# Patient Record
Sex: Male | Born: 1955
Health system: Southern US, Community
[De-identification: ages and names within clinical notes are randomized; demographics above are authoritative.]

## PROBLEM LIST (undated history)

## (undated) DIAGNOSIS — E119 Type 2 diabetes mellitus without complications: Secondary | ICD-10-CM

## (undated) DIAGNOSIS — J439 Emphysema, unspecified: Secondary | ICD-10-CM

## (undated) DIAGNOSIS — E785 Hyperlipidemia, unspecified: Secondary | ICD-10-CM

## (undated) DIAGNOSIS — C4491 Basal cell carcinoma of skin, unspecified: Secondary | ICD-10-CM

## (undated) HISTORY — DX: Hyperlipidemia, unspecified: E78.5

## (undated) HISTORY — DX: Emphysema, unspecified: J43.9

## (undated) HISTORY — DX: Basal cell carcinoma of skin, unspecified: C44.91

## (undated) HISTORY — PX: COLONOSCOPY: SHX174

## (undated) HISTORY — PX: UPPER GASTROINTESTINAL ENDOSCOPY: SHX188

---

## 1999-03-16 ENCOUNTER — Emergency Department (HOSPITAL_COMMUNITY): Admission: EM | Admit: 1999-03-16 | Discharge: 1999-03-16 | Payer: Self-pay | Admitting: Emergency Medicine

## 1999-03-16 ENCOUNTER — Encounter: Payer: Self-pay | Admitting: Emergency Medicine

## 1999-03-29 ENCOUNTER — Encounter: Admission: RE | Admit: 1999-03-29 | Discharge: 1999-06-27 | Payer: Self-pay | Admitting: *Deleted

## 1999-03-30 ENCOUNTER — Encounter: Payer: Self-pay | Admitting: *Deleted

## 1999-03-30 ENCOUNTER — Ambulatory Visit: Admission: RE | Admit: 1999-03-30 | Discharge: 1999-03-30 | Payer: Self-pay | Admitting: *Deleted

## 1999-04-20 ENCOUNTER — Encounter: Payer: Self-pay | Admitting: Occupational Medicine

## 1999-04-20 ENCOUNTER — Ambulatory Visit (HOSPITAL_COMMUNITY): Admission: RE | Admit: 1999-04-20 | Discharge: 1999-04-20 | Payer: Self-pay | Admitting: Occupational Medicine

## 2007-04-17 ENCOUNTER — Ambulatory Visit (HOSPITAL_BASED_OUTPATIENT_CLINIC_OR_DEPARTMENT_OTHER): Admission: RE | Admit: 2007-04-17 | Discharge: 2007-04-17 | Payer: Self-pay | Admitting: Orthopedic Surgery

## 2007-09-05 ENCOUNTER — Ambulatory Visit (HOSPITAL_COMMUNITY): Admission: RE | Admit: 2007-09-05 | Discharge: 2007-09-05 | Payer: Self-pay | Admitting: Orthopedic Surgery

## 2011-05-05 NOTE — Op Note (Signed)
NAME:  Jesse Mckee, Jesse Mckee NO.:  [id number]   MEDICAL RECORD NO.:  [id number]          PATIENT TYPE:  AMB   LOCATION:  DSC                          FACILITY:  [facility]   PHYSICIAN:  Deidre Ala, M.D.    DATE OF BIRTH:  Jan 06, 1956   DATE OF PROCEDURE:  04/17/2007  DATE OF DISCHARGE:                               OPERATIVE REPORT   PREOPERATIVE DIAGNOSIS:  1. Impingement left shoulder with type 3 acromion.  2. MRI positive for retracted rotator cuff tear.  3. AC joint arthritis.   POSTOPERATIVE DIAGNOSIS:  1. Impingement syndrome left shoulder with type 3 acromion.  2. Nonrepairable retracted rotator cuff tear.  3. AC joint arthritis, severe.  4. Glenoid labral fraying with intact slap and biceps anchor with      intact long head biceps.   PROCEDURE:  1. Left shoulder operative arthroscopy with subacromial arch      decompression acromioplasty.  2. Extensive debridement of rotator cuff tear and glenoid labral tear      and tuberosity plasty.  3. Arthroscopic distal clavicle resection.   SURGEON:  1. Charlesetta Shanks, M.D.   ASSISTANT:  Phineas Semen, P.A.   ANESTHESIA:  General with endotracheal after scalene block.   CULTURES:  None.   DRAINS:  None.   BLOOD LOSS:  Minimal.   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  He came to the office.  I suspected the  rotator cuff tear and got an MRI scan which showed a complex signal  indicating probably tendinosus superimposed upon retracted rotator cuff  tear that may still be repairable.  He was taken to the operating room  where exam revealed fraying of the superior labrum with an intact long  head biceps with no evidence of biceps tendinopathy when pulled from the  intertubercular area into the joint.  The glenohumeral joint had minor  arthritic changes.  I debrided the rotator, the  labrum, etc.  In the  subacromial space in a sharp craggy anterior acromion which was resected  back to the Caspari margins, obvious arthritic distal clavicle.  The  rotator cuff had marked degeneration within a rim of the avulsed area.  When I debrided this poor tissue and he is a smoker, it was not able to  pull back the rotator cuff to the footprint without the arm abducted  almost 70 degrees.  I did not feel as a smoker this would heal and  therefore debrided the rotator cuff as per [facility]'s  recommendations.  I did a tuberosity plasty to prevent further  impingement.   PROCEDURE:  With anesthesia obtained using a scalene block technique and  then endotracheal tube technique, 1 gram Ancef given IV prophylaxis. The  patient is placed in the supine beach chair position.  The left shoulder  was prepped and draped in standard fashion.  Skin markings were made for  anatomic positioning and 20 mL 0.5% Marcaine with epinephrine was  injected into the subacromial space to open it up.  I then entered the  shoulder through posterior portal, anterolateral portal was established  just lateral to the coracoid.  I then probed and debrided the labrum,  checked the biceps and used the ablator on 1 to smooth.  Portals  reversed and similar shavings carried out.  I then entered the  subacromial space from the posterior portal, anterolateral portal was  established.  I then shaved the anterior undersurface of the acromion  and used the ablator on 1 to smooth.  I then did an acromioplasty with a  6.0 bur to the roof of the subacromial space in the manner of Vincent Gros  thus also releasing the CA ligament.  The scope was then turned medially  sideways where through an anterior portal I did an arthroscopic distal  clavicle resection two shaver-breadths in.  I then entered the shoulder  through the anterolateral portal and viewed the rotator cuff avulsion.  I debrided the edges used the ablator to smooth  and then made another  portal just posterior to try to pull the rotator cuff back to its  footprint and it was not possible to pull it without great amount of  tension.  Given his smoking status and the configuration of the tear I  therefore felt it was not repairable.  I debrided the rotator cuff, used  the ablator to smooth, the biceps was intact.  Then brought in the 6.0  bur and completed tuberosity plasty and ablator was also used to smooth.  The shoulder was then irrigated through the scope, 0.5% Marcaine was not  used for the portals.  The portals left open.  A bulky sterile  compressive dressing was applied with sling.  The patient having  tolerated procedure well was awakened, taken to recovery room in  satisfactory condition to be discharged per outpatient routine, given  Percocet for pain, and told to call the office for appointment for  recheck tomorrow.           ______________________________  V. Charlesetta Shanks, M.D.     VEP/MEDQ  D:  04/17/2007  T:  04/17/2007  Job:  [id number]

## 2015-03-03 ENCOUNTER — Ambulatory Visit (INDEPENDENT_AMBULATORY_CARE_PROVIDER_SITE_OTHER): Payer: 59

## 2015-03-03 ENCOUNTER — Ambulatory Visit (INDEPENDENT_AMBULATORY_CARE_PROVIDER_SITE_OTHER): Payer: 59 | Admitting: Family Medicine

## 2015-03-03 VITALS — BP 130/80 | HR 84 | Temp 97.9°F | Resp 16 | Ht 71.0 in | Wt 228.0 lb

## 2015-03-03 DIAGNOSIS — J3489 Other specified disorders of nose and nasal sinuses: Secondary | ICD-10-CM | POA: Diagnosis not present

## 2015-03-03 DIAGNOSIS — J329 Chronic sinusitis, unspecified: Secondary | ICD-10-CM

## 2015-03-03 DIAGNOSIS — F17200 Nicotine dependence, unspecified, uncomplicated: Secondary | ICD-10-CM

## 2015-03-03 DIAGNOSIS — R059 Cough, unspecified: Secondary | ICD-10-CM

## 2015-03-03 DIAGNOSIS — Z720 Tobacco use: Secondary | ICD-10-CM | POA: Diagnosis not present

## 2015-03-03 DIAGNOSIS — R05 Cough: Secondary | ICD-10-CM | POA: Diagnosis not present

## 2015-03-03 LAB — POCT CBC
Granulocyte percent: 74.1 %G (ref 37–80)
HCT, POC: 52.6 % (ref 43.5–53.7)
Hemoglobin: 17.4 g/dL (ref 14.1–18.1)
Lymph, poc: 3.7 — AB (ref 0.6–3.4)
MCH, POC: 29.4 pg (ref 27–31.2)
MCHC: 33 g/dL (ref 31.8–35.4)
MCV: 89 fL (ref 80–97)
MID (cbc): 0.6 (ref 0–0.9)
MPV: 7.4 fL (ref 0–99.8)
POC Granulocyte: 12.2 — AB (ref 2–6.9)
POC LYMPH PERCENT: 22.3 %L (ref 10–50)
POC MID %: 3.6 %M (ref 0–12)
Platelet Count, POC: 224 10*3/uL (ref 142–424)
RBC: 5.91 M/uL (ref 4.69–6.13)
RDW, POC: 13.9 %
WBC: 16.4 10*3/uL — AB (ref 4.6–10.2)

## 2015-03-03 MED ORDER — AMOXICILLIN 875 MG PO TABS: 875.0000 mg | ORAL_TABLET | Freq: Two times a day (BID) | ORAL | Status: DC

## 2015-03-03 NOTE — Progress Notes (Signed)
    MRN: [id number] DOB: 1956/03/23  Subjective:   Jesse Mckee is a 59 y.o. male presenting for chief complaint of Headache and Sinusitis  Reports 4 day history of sinus pain, non-productive dry hacking cough, ear fullness and pain bilaterally. Has tried otc medications including alka-seltzer, antihistamines, Afrin with minimal relief. Denies fevers, sinus congestion, rhinorrhea, ear drainage, sore throat, chest pain, chest tightness, wheezing, shob, n/v, abdominal pain. Wife has been sick with cold symptoms. Admits history of seasonal allergies, controlled with PO antihistamine, denies history of asthma. Smokes 1ppd, has 40+ pack year history, no alcohol. Patient is not interested in quitting smoking. Denies any other aggravating or relieving factors, no other questions or concerns.  Jesse Mckee currently has no medications in their medication list. He has no allergies on file.  Jesse Mckee  has no past medical history on file. Also  has no past surgical history on file.  ROS As in subjective.  Objective:   Vitals: BP 130/80 mmHg  Pulse 84  Temp(Src) 97.9 F (36.6 C) (Oral)  Resp 16  Ht 5\' 11"  (1.803 m)  Wt 228 lb (103.42 kg)  BMI 31.81 kg/m2  SpO2 97%  Physical Exam  Constitutional: He is oriented to person, place, and time and well-developed, well-nourished, and in no distress.  HENT:  TMs injected bilaterally but intact, no effusions. Nasal turbinates with slight erythema and edema bilaterally. Bilateral maxillary sinus tenderness. Oropharynx erythematous with postnasal drainage but no tonsillar abscesses. Mucous membranes moist.  Eyes: Conjunctivae are normal. Right eye exhibits no discharge. Left eye exhibits no discharge. No scleral icterus.  Cardiovascular: Normal rate.  Exam reveals no gallop and no friction rub.   No murmur heard. Pulmonary/Chest: No stridor. No respiratory distress. He has no wheezes. He has no rales. He exhibits no tenderness.  Lymphadenopathy:    He has no  cervical adenopathy.  Neurological: He is alert and oriented to person, place, and time.  Skin: Skin is warm and dry.   Results for orders placed or performed in visit on 03/03/15 (from the past 24 hour(s))  POCT CBC     Status: Abnormal   Collection Time: 03/03/15 12:36 PM  Result Value Ref Range   WBC 16.4 (A) 4.6 - 10.2 K/uL   Lymph, poc 3.7 (A) 0.6 - 3.4   POC LYMPH PERCENT 22.3 10 - 50 %L   MID (cbc) 0.6 0 - 0.9   POC MID % 3.6 0 - 12 %M   POC Granulocyte 12.2 (A) 2 - 6.9   Granulocyte percent 74.1 37 - 80 %G   RBC 5.91 4.69 - 6.13 M/uL   Hemoglobin 17.4 14.1 - 18.1 g/dL   HCT, POC 52.6 43.5 - 53.7 %   MCV 89.0 80 - 97 fL   MCH, POC 29.4 27 - 31.2 pg   MCHC 33.0 31.8 - 35.4 g/dL   RDW, POC 13.9 %   Platelet Count, POC 224 142 - 424 K/uL   MPV 7.4 0 - 99.8 fL   [facility] reading (PRIMARY) by  Dr. Linna Darner and PA-Rosslyn Pasion. Chest: No acute process, normal chest x-ray.  Assessment and Plan :   1. Cough 2. Tobacco use disorder 3. Sinusitis, unspecified chronicity, unspecified location - Start Amoxicillin x10 days, advised rest and hydration. Recommended patient quit smoking, he is not interested right now. - Return if no improvement or worsening symptoms despite antibiotic course.  Jaynee Eagles, PA-C [facility] [phone] 03/03/2015 12:05 PM

## 2015-03-03 NOTE — Progress Notes (Signed)
Patient discussed with [facility]. X-rays were examined and lungs appear clear. With the elevated white blood count agree with need for antibiotic treatment for the sinuses. I did not examine the patient myself, but discussed and agreed with treatment plan.

## 2015-03-03 NOTE — Patient Instructions (Addendum)
- You can use normal saline nasal spray over the counter. - You may take 400mg  of ibuprofen over the counter with food.  Sinusitis Sinusitis is redness, soreness, and inflammation of the paranasal sinuses. Paranasal sinuses are air pockets within the bones of your face (beneath the eyes, the middle of the forehead, or above the eyes). In healthy paranasal sinuses, mucus is able to drain out, and air is able to circulate through them by way of your nose. However, when your paranasal sinuses are inflamed, mucus and air can become trapped. This can allow bacteria and other germs to grow and cause infection. Sinusitis can develop quickly and last only a short time (acute) or continue over a long period (chronic). Sinusitis that lasts for more than 12 weeks is considered chronic.  CAUSES  Causes of sinusitis include:  Allergies.  Structural abnormalities, such as displacement of the cartilage that separates your nostrils (deviated septum), which can decrease the air flow through your nose and sinuses and affect sinus drainage.  Functional abnormalities, such as when the small hairs (cilia) that line your sinuses and help remove mucus do not work properly or are not present. SIGNS AND SYMPTOMS  Symptoms of acute and chronic sinusitis are the same. The primary symptoms are pain and pressure around the affected sinuses. Other symptoms include:  Upper toothache.  Earache.  Headache.  Bad breath.  Decreased sense of smell and taste.  A cough, which worsens when you are lying flat.  Fatigue.  Fever.  Thick drainage from your nose, which often is green and may contain pus (purulent).  Swelling and warmth over the affected sinuses. DIAGNOSIS  Your health care provider will perform a physical exam. During the exam, your health care provider may:  Look in your nose for signs of abnormal growths in your nostrils (nasal polyps).  Tap over the affected sinus to check for signs of  infection.  View the inside of your sinuses (endoscopy) using an imaging device that has a light attached (endoscope). If your health care provider suspects that you have chronic sinusitis, one or more of the following tests may be recommended:  Allergy tests.  Nasal culture. A sample of mucus is taken from your nose, sent to a lab, and screened for bacteria.  Nasal cytology. A sample of mucus is taken from your nose and examined by your health care provider to determine if your sinusitis is related to an allergy. TREATMENT  Most cases of acute sinusitis are related to a viral infection and will resolve on their own within 10 days. Sometimes medicines are prescribed to help relieve symptoms (pain medicine, decongestants, nasal steroid sprays, or saline sprays).  However, for sinusitis related to a bacterial infection, your health care provider will prescribe antibiotic medicines. These are medicines that will help kill the bacteria causing the infection.  Rarely, sinusitis is caused by a fungal infection. In theses cases, your health care provider will prescribe antifungal medicine. For some cases of chronic sinusitis, surgery is needed. Generally, these are cases in which sinusitis recurs more than 3 times per year, despite other treatments. HOME CARE INSTRUCTIONS   Drink plenty of water. Water helps thin the mucus so your sinuses can drain more easily.  Use a humidifier.  Inhale steam 3 to 4 times a day (for example, sit in the bathroom with the shower running).  Apply a warm, moist washcloth to your face 3 to 4 times a day, or as directed by your health care provider.  Use saline nasal sprays to help moisten and clean your sinuses.  Take medicines only as directed by your health care provider.  If you were prescribed either an antibiotic or antifungal medicine, finish it all even if you start to feel better. SEEK IMMEDIATE MEDICAL CARE IF:  You have increasing pain or severe  headaches.  You have nausea, vomiting, or drowsiness.  You have swelling around your face.  You have vision problems.  You have a stiff neck.  You have difficulty breathing. MAKE SURE YOU:   Understand these instructions.  Will watch your condition.  Will get help right away if you are not doing well or get worse. Document Released: 12/04/2005 Document Revised: 04/20/2014 Document Reviewed: 12/19/2011 [facility] Patient Information 2015 [place], [place]. This information is not intended to replace advice given to you by your health care provider. Make sure you discuss any questions you have with your health care provider.

## 2015-05-28 ENCOUNTER — Ambulatory Visit (INDEPENDENT_AMBULATORY_CARE_PROVIDER_SITE_OTHER): Payer: 59

## 2015-05-28 ENCOUNTER — Other Ambulatory Visit: Payer: Self-pay | Admitting: Family Medicine

## 2015-05-28 ENCOUNTER — Ambulatory Visit (INDEPENDENT_AMBULATORY_CARE_PROVIDER_SITE_OTHER): Payer: 59 | Admitting: Family Medicine

## 2015-05-28 VITALS — BP 138/82 | HR 84 | Temp 97.8°F | Resp 17 | Ht 71.5 in | Wt 226.2 lb

## 2015-05-28 DIAGNOSIS — R140 Abdominal distension (gaseous): Secondary | ICD-10-CM

## 2015-05-28 DIAGNOSIS — R1010 Upper abdominal pain, unspecified: Secondary | ICD-10-CM

## 2015-05-28 DIAGNOSIS — R1012 Left upper quadrant pain: Principal | ICD-10-CM

## 2015-05-28 DIAGNOSIS — R1011 Right upper quadrant pain: Secondary | ICD-10-CM

## 2015-05-28 DIAGNOSIS — R112 Nausea with vomiting, unspecified: Secondary | ICD-10-CM

## 2015-05-28 DIAGNOSIS — F1021 Alcohol dependence, in remission: Secondary | ICD-10-CM | POA: Diagnosis not present

## 2015-05-28 DIAGNOSIS — R739 Hyperglycemia, unspecified: Secondary | ICD-10-CM

## 2015-05-28 LAB — COMPREHENSIVE METABOLIC PANEL
ALBUMIN: 4.4 g/dL (ref 3.5–5.2)
ALT: 20 U/L (ref 0–53)
AST: 14 U/L (ref 0–37)
Alkaline Phosphatase: 111 U/L (ref 39–117)
BUN: 9 mg/dL (ref 6–23)
CALCIUM: 9.8 mg/dL (ref 8.4–10.5)
CO2: 26 meq/L (ref 19–32)
Chloride: 99 mEq/L (ref 96–112)
Creat: 0.74 mg/dL (ref 0.50–1.35)
Glucose, Bld: 205 mg/dL — ABNORMAL HIGH (ref 70–99)
Potassium: 4.4 mEq/L (ref 3.5–5.3)
SODIUM: 136 meq/L (ref 135–145)
Total Bilirubin: 0.8 mg/dL (ref 0.2–1.2)
Total Protein: 7.1 g/dL (ref 6.0–8.3)

## 2015-05-28 LAB — POCT URINALYSIS DIPSTICK
Bilirubin, UA: NEGATIVE
Blood, UA: NEGATIVE
Glucose, UA: NEGATIVE
Ketones, UA: NEGATIVE
Leukocytes, UA: NEGATIVE
Nitrite, UA: NEGATIVE
Protein, UA: NEGATIVE
Spec Grav, UA: 1.01
Urobilinogen, UA: 0.2
pH, UA: 6

## 2015-05-28 LAB — POCT CBC
Granulocyte percent: 70.6 %G (ref 37–80)
HCT, POC: 51.7 % (ref 43.5–53.7)
Hemoglobin: 17.4 g/dL (ref 14.1–18.1)
Lymph, poc: 3.2 (ref 0.6–3.4)
MCH, POC: 29.4 pg (ref 27–31.2)
MCHC: 33.6 g/dL (ref 31.8–35.4)
MCV: 87.6 fL (ref 80–97)
MID (cbc): 0.8 (ref 0–0.9)
MPV: 7.2 fL (ref 0–99.8)
POC GRANULOCYTE: 9.7 — AB (ref 2–6.9)
POC LYMPH PERCENT: 23.3 %L (ref 10–50)
POC MID %: 6.1 % (ref 0–12)
Platelet Count, POC: 253 10*3/uL (ref 142–424)
RBC: 5.9 M/uL (ref 4.69–6.13)
RDW, POC: 13.7 %
WBC: 13.8 10*3/uL — AB (ref 4.6–10.2)

## 2015-05-28 LAB — POCT UA - MICROSCOPIC ONLY
CASTS, UR, LPF, POC: NEGATIVE
CRYSTALS, UR, HPF, POC: NEGATIVE
Mucus, UA: NEGATIVE
RBC, URINE, MICROSCOPIC: NEGATIVE
YEAST UA: NEGATIVE

## 2015-05-28 NOTE — Patient Instructions (Signed)
Drink plenty of liquids. Only eat light meals today.  Take MiraLAX one dose now and another dose tonight at bedtime if needed. If necessary can also take some milk of magnesia.  Try and walk around a little bit which sometimes helps get the bowels moving better.  If you develop significant fevers, increased pain, passing any blood, or recurrent vomiting, or any other concerning symptoms go to the emergency room.  Plan to come back tomorrow morning to let me recheck you and we will check a blood count when you come in.

## 2015-05-28 NOTE — Progress Notes (Signed)
Subjective:  Patient ID: Jesse Mckee, male    DOB: 11-29-1956  Age: 60 y.o. 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 He is generally quite healthy. He brought his wife in early in the week. After going home that night he developed epigastric pain, nausea and bloating and some vomiting a believe. He did not had diarrhea. His bowels moved after he took some milk of magnesia. He has persisted with a lot of bloating and rumbling in discomfort. He feels like he has hot flashes, like his fever may be going up and down He does not use drugs. He does have a history of alcoholism, not not a drinker for the last 6 years. He has not had any surgery on his abdomen. He does continue to smoke about a pack a day, no quit plan in place.   Objective:   Healthy-appearing man in no major distress. TMs normal. Throat clear. Neck supple without significant nodes. Chest clear to auscultation. Heart regular without murmurs. Abdomen has fairly active bowel sounds, no tinkling or rushes. He is a little tender across the epigastrium on percussion. His abdomen is soft without masses he is tender in both upper quadrants of his abdomen, maybe a little more on the right than the left. A little bit of rebounding on the right. Extremities normal. Skin normal.  Will plan to check blood chemistries, CBC, urinalysis, and abdominal x-rays  [facility] reading (PRIMARY) by  Dr. Linna Darner Possible COPD on chest. Abdominal films do not show any free air. Extensive stool burden. No other gross abnormalities noted. No air-fluid levels. No loops of bowel.  Results for orders placed or performed in visit on 05/28/15  POCT urinalysis dipstick  Result Value Ref Range   Color, UA yellow    Clarity, UA clear    Glucose, UA neg    Bilirubin, UA neg    Ketones, UA neg    Spec Grav, UA 1.010    Blood, UA neg    pH, UA 6.0    Protein, UA neg    Urobilinogen, UA 0.2    Nitrite, UA neg    Leukocytes, UA Negative   POCT UA  - Microscopic Only  Result Value Ref Range   WBC, Ur, HPF, POC 0-2    RBC, urine, microscopic neg    Bacteria, U Microscopic naeg    Mucus, UA neg    Epithelial cells, urine per micros 0-1    Crystals, Ur, HPF, POC neg    Casts, Ur, LPF, POC neg    Yeast, UA neg   POCT CBC  Result Value Ref Range   WBC 13.8 (A) 4.6 - 10.2 K/uL   Lymph, poc 3.2 0.6 - 3.4   POC LYMPH PERCENT 23.3 10 - 50 %L   MID (cbc) 0.8 0 - 0.9   POC MID % 6.1 0 - 12 %M   POC Granulocyte 9.7 (A) 2 - 6.9   Granulocyte percent 70.6 37 - 80 %G   RBC 5.90 4.69 - 6.13 M/uL   Hemoglobin 17.4 14.1 - 18.1 g/dL   HCT, POC 51.7 43.5 - 53.7 %   MCV 87.6 80 - 97 fL   MCH, POC 29.4 27 - 31.2 pg   MCHC 33.6 31.8 - 35.4 g/dL   RDW, POC 13.7 %   Platelet Count, POC 253 142 - 424 K/uL   MPV 7.2 0 - 99.8 fL   .    Assessment & Plan:   Assessment:  Abdominal pain, primarily upper abdomen, more right upper than left upper Constipation  Plan: When he comes back to see me tomorrow please do a CBC on arrival Patient Instructions  Drink plenty of liquids. Only eat light meals today.  Take MiraLAX one dose now and another dose tonight at bedtime if needed. If necessary can also take some milk of magnesia.  Try and walk around a little bit which sometimes helps get the bowels moving better.  If you develop significant fevers, increased pain, passing any blood, or recurrent vomiting, or any other concerning symptoms go to the emergency room.  Plan to come back tomorrow morning to let me recheck you and we will check a blood count when you come in.     HOPPER,DAVID, MD 05/28/2015

## 2015-05-29 ENCOUNTER — Ambulatory Visit (HOSPITAL_BASED_OUTPATIENT_CLINIC_OR_DEPARTMENT_OTHER)
Admission: RE | Admit: 2015-05-29 | Discharge: 2015-05-29 | Disposition: A | Discharge status: Home / Self Care | Payer: 59 | Source: Ambulatory Visit | Attending: Physician Assistant | Admitting: Physician Assistant

## 2015-05-29 ENCOUNTER — Ambulatory Visit (INDEPENDENT_AMBULATORY_CARE_PROVIDER_SITE_OTHER): Payer: 59 | Admitting: Family Medicine

## 2015-05-29 ENCOUNTER — Encounter (HOSPITAL_BASED_OUTPATIENT_CLINIC_OR_DEPARTMENT_OTHER): Payer: Self-pay

## 2015-05-29 VITALS — BP 128/76 | HR 95 | Temp 98.4°F | Resp 18 | Ht 71.5 in | Wt 224.8 lb

## 2015-05-29 DIAGNOSIS — R1011 Right upper quadrant pain: Secondary | ICD-10-CM | POA: Diagnosis not present

## 2015-05-29 DIAGNOSIS — R1010 Upper abdominal pain, unspecified: Secondary | ICD-10-CM

## 2015-05-29 DIAGNOSIS — K5730 Diverticulosis of large intestine without perforation or abscess without bleeding: Secondary | ICD-10-CM | POA: Diagnosis not present

## 2015-05-29 DIAGNOSIS — R1012 Left upper quadrant pain: Secondary | ICD-10-CM

## 2015-05-29 LAB — POCT CBC
Granulocyte percent: 71.4 %G (ref 37–80)
HCT, POC: 49.8 % (ref 43.5–53.7)
Hemoglobin: 17.2 g/dL (ref 14.1–18.1)
LYMPH, POC: 3.1 (ref 0.6–3.4)
MCH, POC: 29.9 pg (ref 27–31.2)
MCHC: 34.6 g/dL (ref 31.8–35.4)
MCV: 86.5 fL (ref 80–97)
MID (CBC): 0.6 (ref 0–0.9)
MPV: 7.2 fL (ref 0–99.8)
POC Granulocyte: 9.3 — AB (ref 2–6.9)
POC LYMPH PERCENT: 24 %L (ref 10–50)
POC MID %: 4.6 %M (ref 0–12)
Platelet Count, POC: 248 10*3/uL (ref 142–424)
RBC: 5.76 M/uL (ref 4.69–6.13)
RDW, POC: 13.5 %
WBC: 13 10*3/uL — AB (ref 4.6–10.2)

## 2015-05-29 MED ORDER — METRONIDAZOLE 500 MG PO TABS: 500.0000 mg | ORAL_TABLET | Freq: Two times a day (BID) | ORAL | Status: DC

## 2015-05-29 MED ORDER — IOHEXOL 300 MG/ML  SOLN
100.0000 mL | Freq: Once | INTRAMUSCULAR | Status: AC | PRN
  Administered 2015-05-29: 100 mL via INTRAVENOUS

## 2015-05-29 MED ORDER — CIPROFLOXACIN HCL 500 MG PO TABS: 500.0000 mg | ORAL_TABLET | Freq: Two times a day (BID) | ORAL | Status: DC

## 2015-05-29 NOTE — Progress Notes (Signed)
  Subjective:  Patient ID: Jesse Mckee, male    DOB: 03-Jan-1956  Age: 59 y.o. MRN: [id number]  Patient is here for recheck with regard to his right upper quadrant pain. Actually is not hurting as much across the whole upper abdomen today as it was yesterday, and it is hurting more than the right upper quadrant still. He has not had nausea or vomiting. He does have some pain back into his spine but he thinks that's from activity. He has had normal bowel movement. No fever.   Objective:   Chest clear. Heart regular. Abdomen has normal bowel sounds. Soft but tender in the right upper quadrant  Assessment & Plan:    Assessment: Right upper quadrant abdominal pain. Rule out acute abdomen such as gallbladder, diverticulitis, or even pancreatitis    Plan: Get CT scan today. Patient Instructions  CT scan of abdomen at the [facility] [address] facility. You are to be there by 2:30.  No change in treatment. We will let you know the results of your study.   If you are abruptly worse at anytime return or go to the emergency room.      CT scan showed diverticulosis but no diverticulitis. However given the fact that he has diverticulosis I think I will put him on anabolic in case he has a very low-grade diverticulitis colitis causing the persistent elevated white blood count even though we cannot see it on CT. Will go ahead and treat with Cipro and Flagyl. I calledthe patient and explained things to him and he understands to return if in all worse at anytime. He will still take the MiraLAX as I discussed with him.  HOPPER,DAVID, MD 05/29/2015

## 2015-05-29 NOTE — Patient Instructions (Addendum)
CT scan of abdomen at the [facility] [address] facility. You are to be there by 2:30.  No change in treatment. We will let you know the results of your study.   If you are abruptly worse at anytime return or go to the emergency room.

## 2015-05-31 ENCOUNTER — Telehealth: Payer: Self-pay

## 2015-05-31 NOTE — Telephone Encounter (Signed)
Patient was seen at [facility] on 05/29/15 for a Ct Abdomen  Pelvis With contrast.  I have submitted for prior auth retro for patient with [facility] and it is requiring a Peer to Peer Review. Please contact [place] at [phone] option #3 case #[id number], [facility] id #[id number] group [phone]

## 2015-06-02 ENCOUNTER — Encounter: Payer: Self-pay | Admitting: *Deleted

## 2015-06-02 ENCOUNTER — Other Ambulatory Visit (INDEPENDENT_AMBULATORY_CARE_PROVIDER_SITE_OTHER): Payer: 59

## 2015-06-02 DIAGNOSIS — R739 Hyperglycemia, unspecified: Secondary | ICD-10-CM

## 2015-06-02 LAB — GLUCOSE, POCT (MANUAL RESULT ENTRY): POC Glucose: 225 mg/dl — AB (ref 70–99)

## 2015-06-02 LAB — HEMOGLOBIN A1C
Hgb A1c MFr Bld: 7.7 % — ABNORMAL HIGH (ref <5.7)
MEAN PLASMA GLUCOSE: 174 mg/dL — AB (ref <117)

## 2015-06-16 ENCOUNTER — Encounter: Payer: Self-pay | Admitting: Radiology

## 2016-02-18 ENCOUNTER — Ambulatory Visit (INDEPENDENT_AMBULATORY_CARE_PROVIDER_SITE_OTHER): Payer: 59 | Admitting: Emergency Medicine

## 2016-02-18 ENCOUNTER — Ambulatory Visit (INDEPENDENT_AMBULATORY_CARE_PROVIDER_SITE_OTHER): Payer: 59

## 2016-02-18 VITALS — BP 148/78 | HR 95 | Temp 99.4°F | Resp 16 | Ht 71.0 in | Wt 229.0 lb

## 2016-02-18 DIAGNOSIS — R059 Cough, unspecified: Secondary | ICD-10-CM

## 2016-02-18 DIAGNOSIS — J101 Influenza due to other identified influenza virus with other respiratory manifestations: Secondary | ICD-10-CM

## 2016-02-18 DIAGNOSIS — R05 Cough: Secondary | ICD-10-CM

## 2016-02-18 DIAGNOSIS — R509 Fever, unspecified: Secondary | ICD-10-CM | POA: Diagnosis not present

## 2016-02-18 LAB — POCT CBC
GRANULOCYTE PERCENT: 78.8 % (ref 37–80)
HCT, POC: 47.6 % (ref 43.5–53.7)
Hemoglobin: 17.4 g/dL (ref 14.1–18.1)
Lymph, poc: 1.6 (ref 0.6–3.4)
MCH, POC: 31.4 pg — AB (ref 27–31.2)
MCHC: 36.5 g/dL — AB (ref 31.8–35.4)
MCV: 85.9 fL (ref 80–97)
MID (CBC): 0.5 (ref 0–0.9)
MPV: 6.7 fL (ref 0–99.8)
POC Granulocyte: 8 — AB (ref 2–6.9)
POC LYMPH %: 16.2 % (ref 10–50)
POC MID %: 5 %M (ref 0–12)
Platelet Count, POC: 153 10*3/uL (ref 142–424)
RBC: 5.54 M/uL (ref 4.69–6.13)
RDW, POC: 13.9 %
WBC: 10.1 10*3/uL (ref 4.6–10.2)

## 2016-02-18 LAB — POCT INFLUENZA A/B
INFLUENZA B, POC: POSITIVE — AB
Influenza A, POC: NEGATIVE

## 2016-02-18 MED ORDER — OSELTAMIVIR PHOSPHATE 75 MG PO CAPS: 75.0000 mg | ORAL_CAPSULE | Freq: Two times a day (BID) | ORAL | Status: DC

## 2016-02-18 MED ORDER — BENZONATATE 100 MG PO CAPS: 100.0000 mg | ORAL_CAPSULE | Freq: Three times a day (TID) | ORAL | Status: DC | PRN

## 2016-02-18 NOTE — Progress Notes (Signed)
By signing my name below, I, Raven Small, attest that this documentation has been prepared under the direction and in the presence of Arlyss Queen, MD.  Electronically Signed: Thea Alken, ED Scribe. 02/18/2016. 12:12 PM.   Chief Complaint:  Chief Complaint  Patient presents with  . Cough    Wednesday morning   . Fever  . Diarrhea  . Headache    HPI: Jesse Mckee is a 60 y.o. male who reports to [facility] today complaining of cough. Pt states symptoms started 2 days ago with body aches and nasal congestion. He developed fever and chills yesterday along with dry cough and HA. Pt has been taking OTC mucinex without relief to symptoms. He did not receive flu shot this year. Pt is a smoker.   No past medical history on file. No past surgical history on file. Social History   Social History  . Marital Status: Married    Spouse Name: N/A  . Number of Children: N/A  . Years of Education: N/A   Social History Main Topics  . Smoking status: Current Every Day Smoker -- 1.00 packs/day for 40 years    Types: Cigarettes  . Smokeless tobacco: None  . Alcohol Use: No  . Drug Use: No  . Sexual Activity: Not Asked   Other Topics Concern  . None   Social History Narrative   Family History  Problem Relation Age of Onset  . Cancer Mother   . Cancer Brother    No Known Allergies Prior to Admission medications   Not on File     ROS: The patient denies night sweats, unintentional weight loss, chest pain, palpitations, wheezing, dyspnea on exertion, nausea, vomiting, abdominal pain, dysuria, hematuria, melena, numbness, weakness, or tingling.   All other systems have been reviewed and were otherwise negative with the exception of those mentioned in the HPI and as above.    PHYSICAL EXAM: Filed Vitals:   02/18/16 1118  BP: 148/78  Pulse: 95  Temp: 99.4 F (37.4 C)  Resp: 16   Body mass index is 31.95 kg/(m^2).   General: Alert, no acute distress HEENT:  Normocephalic,  atraumatic, oropharynx patent. Significant nasal congestion.  Eye: [facility] Cardiovascular:  Regular rate and rhythm, no rubs murmurs or gallops.  No Carotid bruits, radial pulse intact. No pedal edema.  Respiratory: Clear to auscultation bilaterally.  No cyanosis, no use of accessory musculature. Rhonchi in both lungs, but no rales. Frequent cough. Good air exchanges.  Abdominal: No organomegaly, abdomen is soft and non-tender, positive bowel sounds.  No masses. Musculoskeletal: Gait intact. No edema, tenderness Skin: No rashes. Neurologic: Facial musculature symmetric. Psychiatric: Patient acts appropriately throughout our interaction. Lymphatic: No cervical or submandibular lymphadenopathy    LABS: Results for orders placed or performed in visit on 02/18/16  POCT Influenza A/B  Result Value Ref Range   Influenza A, POC Negative Negative   Influenza B, POC Positive (A) Negative  POCT CBC  Result Value Ref Range   WBC 10.1 4.6 - 10.2 K/uL   Lymph, poc 1.6 0.6 - 3.4   POC LYMPH PERCENT 16.2 10 - 50 %L   MID (cbc) 0.5 0 - 0.9   POC MID % 5.0 0 - 12 %M   POC Granulocyte 8.0 (A) 2 - 6.9   Granulocyte percent 78.8 37 - 80 %G   RBC 5.54 4.69 - 6.13 M/uL   Hemoglobin 17.4 14.1 - 18.1 g/dL   HCT, POC 47.6 43.5 - 53.7 %  MCV 85.9 80 - 97 fL   MCH, POC 31.4 (A) 27 - 31.2 pg   MCHC 36.5 (A) 31.8 - 35.4 g/dL   RDW, POC 13.9 %   Platelet Count, POC 153 142 - 424 K/uL   MPV 6.7 0 - 99.8 fL    EKG/XRAY:   Dg Chest 2 View  02/18/2016  CLINICAL DATA:  Cough starting 2 days ago, body aches, nasal congestion EXAM: CHEST  2 VIEW COMPARISON:  05/28/2015 FINDINGS: Cardiomediastinal silhouette is stable. No acute infiltrate or pleural effusion. No pulmonary edema. Bony thorax is unremarkable. IMPRESSION: No active cardiopulmonary disease. Electronically Signed   By: Lahoma Crocker M.D.   On: 02/18/2016 12:53   ASSESSMENT/PLAN: Patient tested positive for influenza B. He will be treated with  Tamiflu twice a day along with Tessalon Perles.I personally performed the services described in this documentation, which was scribed in my presence. The recorded information has been reviewed and is accurate. 1   Gross sideeffects, risk and benefits, and alternatives of medications d/w patient. Patient is aware that all medications have potential sideeffects and we are unable to predict every sideeffect or drug-drug interaction that may occur.  Arlyss Queen MD 02/18/2016 12:12 PM

## 2016-02-18 NOTE — Patient Instructions (Addendum)
Because you received an x-ray today, you will receive an invoice from [facility] Radiology. Please contact [facility] Radiology at [phone] with questions or concerns regarding your invoice. Our billing staff will not be able to assist you with those questions. Influenza, Adult Influenza ("the flu") is a viral infection of the respiratory tract. It occurs more often in winter months because people spend more time in close contact with one another. Influenza can make you feel very sick. Influenza easily spreads from person to person (contagious). CAUSES  Influenza is caused by a virus that infects the respiratory tract. You can catch the virus by breathing in droplets from an infected person's cough or sneeze. You can also catch the virus by touching something that was recently contaminated with the virus and then touching your mouth, nose, or eyes. RISKS AND COMPLICATIONS You may be at risk for a more severe case of influenza if you smoke cigarettes, have diabetes, have chronic heart disease (such as heart failure) or lung disease (such as asthma), or if you have a weakened immune system. Elderly people and pregnant women are also at risk for more serious infections. The most common problem of influenza is a lung infection (pneumonia). Sometimes, this problem can require emergency medical care and may be life threatening. SIGNS AND SYMPTOMS  Symptoms typically last 4 to 10 days and may include:  Fever.  Chills.  Headache, body aches, and muscle aches.  Sore throat.  Chest discomfort and cough.  Poor appetite.  Weakness or feeling tired.  Dizziness.  Nausea or vomiting. DIAGNOSIS  Diagnosis of influenza is often made based on your history and a physical exam. A nose or throat swab test can be done to confirm the diagnosis. TREATMENT  In mild cases, influenza goes away on its own. Treatment is directed at relieving symptoms. For more severe cases, your health care provider may  prescribe antiviral medicines to shorten the sickness. Antibiotic medicines are not effective because the infection is caused by a virus, not by bacteria. HOME CARE INSTRUCTIONS  Take medicines only as directed by your health care provider.  Use a cool mist humidifier to make breathing easier.  Get plenty of rest until your temperature returns to normal. This usually takes 3 to 4 days.  Drink enough fluid to keep your urine clear or pale yellow.  Cover yourmouth and nosewhen coughing or sneezing,and wash your handswellto prevent thevirusfrom spreading.  Stay homefromwork orschool untilthe fever is gonefor at least 34full day. PREVENTION  An annual influenza vaccination (flu shot) is the best way to avoid getting influenza. An annual flu shot is now routinely recommended for all adults in the [place] IF:  You experiencechest pain, yourcough worsens,or you producemore mucus.  Youhave nausea,vomiting, ordiarrhea.  Your fever returns or gets worse. SEEK IMMEDIATE MEDICAL CARE IF:  You havetrouble breathing, you become short of breath,or your skin ornails becomebluish.  You have severe painor stiffnessin the neck.  You develop a sudden headache, or pain in the face or ear.  You have nausea or vomiting that you cannot control. MAKE SURE YOU:   Understand these instructions.  Will watch your condition.  Will get help right away if you are not doing well or get worse.   This information is not intended to replace advice given to you by your health care provider. Make sure you discuss any questions you have with your health care provider.   Document Released: 12/01/2000 Document Revised: 12/25/2014 Document Reviewed: 03/04/2012 Elsevier Interactive  Patient Education 2016 [employer].

## 2016-02-22 ENCOUNTER — Encounter: Payer: Self-pay | Admitting: *Deleted

## 2016-10-09 ENCOUNTER — Encounter: Payer: Self-pay | Admitting: Family Medicine

## 2016-10-09 LAB — COLOGUARD: Cologuard: NEGATIVE

## 2016-12-15 ENCOUNTER — Ambulatory Visit (INDEPENDENT_AMBULATORY_CARE_PROVIDER_SITE_OTHER): Payer: 59 | Admitting: Physician Assistant

## 2016-12-15 ENCOUNTER — Ambulatory Visit (INDEPENDENT_AMBULATORY_CARE_PROVIDER_SITE_OTHER): Payer: 59

## 2016-12-15 VITALS — BP 130/80 | HR 90 | Temp 98.0°F | Resp 18 | Ht 71.0 in | Wt 224.0 lb

## 2016-12-15 DIAGNOSIS — R0981 Nasal congestion: Secondary | ICD-10-CM | POA: Diagnosis not present

## 2016-12-15 DIAGNOSIS — R059 Cough, unspecified: Secondary | ICD-10-CM

## 2016-12-15 DIAGNOSIS — R05 Cough: Secondary | ICD-10-CM | POA: Diagnosis not present

## 2016-12-15 DIAGNOSIS — J322 Chronic ethmoidal sinusitis: Secondary | ICD-10-CM

## 2016-12-15 LAB — POCT CBC
Granulocyte percent: 73.1 % (ref 37–80)
HCT, POC: 48.9 % (ref 43.5–53.7)
Hemoglobin: 17.8 g/dL (ref 14.1–18.1)
Lymph, poc: 2.9 (ref 0.6–3.4)
MCH, POC: 31.3 pg — AB (ref 27–31.2)
MCHC: 36.4 g/dL — AB (ref 31.8–35.4)
MCV: 86.1 fL (ref 80–97)
MID (cbc): 0.3 (ref 0–0.9)
MPV: 7.2 fL (ref 0–99.8)
POC Granulocyte: 8.7 — AB (ref 2–6.9)
POC LYMPH PERCENT: 24.4 % (ref 10–50)
POC MID %: 2.5 %M (ref 0–12)
Platelet Count, POC: 212 10*3/uL (ref 142–424)
RBC: 5.68 M/uL (ref 4.69–6.13)
RDW, POC: 12.9 %
WBC: 11.9 10*3/uL — AB (ref 4.6–10.2)

## 2016-12-15 MED ORDER — AMOXICILLIN-POT CLAVULANATE 875-125 MG PO TABS: 1.0000 | ORAL_TABLET | Freq: Two times a day (BID) | ORAL | 0 refills | Status: DC

## 2016-12-15 MED ORDER — OXYMETAZOLINE HCL 0.05 % NA SOLN: 1.0000 | Freq: Two times a day (BID) | NASAL | 0 refills | Status: DC

## 2016-12-15 NOTE — Progress Notes (Signed)
Jesse Mckee  MRN: [id number] DOB: 02-01-1956  PCP: No PCP Per Patient  Subjective:  Pt is a 60 year old male who presents to clinic for cough and sinus pressure x 10 days. Cough is non-productive. +headache, runny nose, facial pressure. His symptoms are getting worse. He has tried Mucinex Max, Sinus spray - not helping. Denies chest pain, chest pressure, fever, chills, abdominal pain, nausea, vomiting.  History of smoking - 47 pack year history.   Review of Systems  Constitutional: Negative for chills, diaphoresis and fever.  HENT: Positive for congestion, postnasal drip, rhinorrhea, sinus pain and sinus pressure. Negative for sore throat and trouble swallowing.   Respiratory: Positive for cough. Negative for chest tightness, shortness of breath and wheezing.   Cardiovascular: Negative for chest pain and palpitations.  Gastrointestinal: Negative for abdominal pain, diarrhea, nausea and vomiting.  Neurological: Positive for headaches. Negative for dizziness, syncope and light-headedness.  Psychiatric/Behavioral: Negative for sleep disturbance. The patient is not nervous/anxious.     Patient Active Problem List   Diagnosis Date Noted  . History of alcoholism ([place]) 05/28/2015    No current outpatient prescriptions on file prior to visit.   No current facility-administered medications on file prior to visit.     No Known Allergies   Objective:  BP 130/80 (BP Location: Right Arm, Patient Position: Sitting, Cuff Size: Small)   Pulse 90   Temp 98 F (36.7 C) (Oral)   Resp 18   Ht 5\' 11"  (1.803 m)   Wt 224 lb (101.6 kg)   SpO2 96%   BMI 31.24 kg/m   Physical Exam  Constitutional: He is oriented to person, place, and time and well-developed, well-nourished, and in no distress. No distress.  HENT:  Right Ear: Tympanic membrane normal.  Left Ear: Tympanic membrane normal.  Mouth/Throat: Mucous membranes are normal. Posterior oropharyngeal edema present. No oropharyngeal  exudate or posterior oropharyngeal erythema.  Cardiovascular: Normal rate, regular rhythm and normal heart sounds.   Pulmonary/Chest: Effort normal. He has no decreased breath sounds. He has no wheezes. He has no rhonchi. He has no rales.  Neurological: He is alert and oriented to person, place, and time. GCS score is 15.  Skin: Skin is warm and dry.  Psychiatric: Mood, memory, affect and judgment normal.  Vitals reviewed.  Dg Chest 2 View  Result Date: 12/15/2016 CLINICAL DATA:  Cough for 10 days. EXAM: CHEST  2 VIEW COMPARISON:  02/18/2016 FINDINGS: Cardiac silhouette is normal in size and configuration. No mediastinal or hilar masses or evidence of adenopathy. Minor reticular scarring at the right lateral lung base. Lungs otherwise clear. No pleural effusion. No pneumothorax. Skeletal structures are intact. IMPRESSION: No active cardiopulmonary disease. Electronically Signed   By: Lajean Manes M.D.   On: 12/15/2016 10:28    Results for orders placed or performed in visit on 12/15/16  POCT CBC  Result Value Ref Range   WBC 11.9 (A) 4.6 - 10.2 K/uL   Lymph, poc 2.9 0.6 - 3.4   POC LYMPH PERCENT 24.4 10 - 50 %L   MID (cbc) 0.3 0 - 0.9   POC MID % 2.5 0 - 12 %M   POC Granulocyte 8.7 (A) 2 - 6.9   Granulocyte percent 73.1 37 - 80 %G   RBC 5.68 4.69 - 6.13 M/uL   Hemoglobin 17.8 14.1 - 18.1 g/dL   HCT, POC 48.9 43.5 - 53.7 %   MCV 86.1 80 - 97 fL   MCH, POC 31.3 (A)  27 - 31.2 pg   MCHC 36.4 (A) 31.8 - 35.4 g/dL   RDW, POC 12.9 %   Platelet Count, POC 212 142 - 424 K/uL   MPV 7.2 0 - 99.8 fL    Assessment and Plan :  1. Ethmoid sinusitis, unspecified chronicity 2. Nasal congestion 3. Cough - amoxicillin-clavulanate (AUGMENTIN) 875-125 MG tablet; Take 1 tablet by mouth 2 (two) times daily.  Dispense: 20 tablet; Refill: 0 - POCT CBC - DG Chest 2 View; Future - Will treat for sinusitis, as he has leukocytosis with left shift and negative chest x-ray. Supportive care: Push fluids,  rest, stop smoking. RTC if no improvement.   Mercer Pod, PA-C  [facility] 12/15/2016 10:03 AM

## 2016-12-15 NOTE — Patient Instructions (Addendum)
Please stay well hydrated. Drink at least 2 liters of water a day. Drink warm tea with honey and lemon. Do not drink soda or coffee while you are sick. Please do not smoke while you are sick.  Work hard on cutting down the amount of cigarettes you smoke daily.  Use Afrin 2 sprays each nostril twice a day. DO NOT USE THIS MEDICATION MORE THAN THREE DAYS.   Thank you for coming in today. I hope you feel we met your needs.  Feel free to call [facility] if you have any questions or further requests.  Please consider signing up for MyChart if you do not already have it, as this is a great way to communicate with me.  Best,  Whitney McVey, PA-C   IF you received an x-ray today, you will receive an invoice from [facility] Radiology. Please contact [facility] Radiology at [phone] with questions or concerns regarding your invoice.   IF you received labwork today, you will receive an invoice from [place]. Please contact [facility] at [phone] with questions or concerns regarding your invoice.   Our billing staff will not be able to assist you with questions regarding bills from these companies.  You will be contacted with the lab results as soon as they are available. The fastest way to get your results is to activate your My Chart account. Instructions are located on the last page of this paperwork. If you have not heard from [place] regarding the results in 2 weeks, please contact this office.

## 2016-12-20 ENCOUNTER — Ambulatory Visit (INDEPENDENT_AMBULATORY_CARE_PROVIDER_SITE_OTHER): Payer: 59 | Admitting: Physician Assistant

## 2016-12-20 ENCOUNTER — Other Ambulatory Visit: Payer: Self-pay | Admitting: Physician Assistant

## 2016-12-20 VITALS — BP 118/76 | HR 83 | Temp 98.0°F | Resp 16 | Ht 72.0 in | Wt 224.0 lb

## 2016-12-20 DIAGNOSIS — L989 Disorder of the skin and subcutaneous tissue, unspecified: Secondary | ICD-10-CM | POA: Diagnosis not present

## 2016-12-20 DIAGNOSIS — C44320 Squamous cell carcinoma of skin of unspecified parts of face: Secondary | ICD-10-CM

## 2016-12-20 NOTE — Patient Instructions (Signed)
Please come back in one week. We will discuss the result of your skin biopsy and make a plan.   Thank you for coming in today. I hope you feel we met your needs.  Feel free to call [facility] if you have any questions or further requests.  Please consider signing up for MyChart if you do not already have it, as this is a great way to communicate with me.  Best,  [employer], PA-C

## 2016-12-20 NOTE — Progress Notes (Signed)
   Jesse Mckee  MRN: [id number] DOB: 1956-03-14  PCP: No PCP Per Patient  Subjective:  Pt is a 61 year old male who presents to clinic for skin lesion. C/o spot on his forehead that keeps coming back. He has multiple spots on his face and head, sees dermatologist who regularly freezes them off. This one on his forehead keeps coming back. After several attempts at freezing it off, his dermatologist prescribed a cream to apply. Now he manages it with a cream, over several months "it just falls off", then grows back again.  Has never been sent off for lab testing.    Review of Systems  Constitutional: Negative for chills, diaphoresis and fever.  Respiratory: Negative for cough, chest tightness, shortness of breath and wheezing.   Cardiovascular: Negative for chest pain and palpitations.  Gastrointestinal: Negative for abdominal pain, diarrhea, nausea and vomiting.  Skin: Positive for rash.  Neurological: Negative for dizziness, syncope, light-headedness and headaches.  Psychiatric/Behavioral: Negative for sleep disturbance. The patient is not nervous/anxious.     Patient Active Problem List   Diagnosis Date Noted  . History of alcoholism ([place]) 05/28/2015    Current Outpatient Prescriptions on File Prior to Visit  Medication Sig Dispense Refill  . amoxicillin-clavulanate (AUGMENTIN) 875-125 MG tablet Take 1 tablet by mouth 2 (two) times daily. 20 tablet 0   No current facility-administered medications on file prior to visit.     No Known Allergies   Objective:  BP 118/76 (BP Location: Right Arm, Patient Position: Sitting, Cuff Size: Normal)   Pulse 83   Temp 98 F (36.7 C) (Oral)   Resp 16   Ht 6' (1.829 m)   Wt 224 lb (101.6 kg)   SpO2 96%   BMI 30.38 kg/m   Physical Exam  Constitutional: He is oriented to person, place, and time and well-developed, well-nourished, and in no distress. No distress.  HENT:  Head:    Cardiovascular: Normal rate, regular rhythm and normal  heart sounds.   Pulmonary/Chest: Effort normal. No respiratory distress.  Neurological: He is alert and oriented to person, place, and time. GCS score is 15.  Skin: Skin is warm and dry.  Psychiatric: Mood, memory, affect and judgment normal.  Vitals reviewed.  Procedure: Verbal consent obtained. Skin cleaned with alcohol and anesthetized with Lidocaine with epinephrine. Sterile field applied. Lesion was shaved off with double-edge razor blade and sent to pathology.Bleeding well controlled. Wound dressed and wound care discussed.  Assessment and Plan :  1. Skin lesion of face - Dermatology pathology - Will send sample to pathology for evaluation.   Mercer Pod, PA-C  [facility] 12/20/2016 8:16 AM

## 2016-12-22 ENCOUNTER — Telehealth: Payer: Self-pay

## 2016-12-22 NOTE — Telephone Encounter (Signed)
PATIENT STATES HE SAW WHITNEY MCVEY ON Wednesday FOR A LESION ON HIS FACE. SHE TRIED TO CALL HIM EARLIER TODAY AND HE IS RETURNING HER CALL. BEST PHONE [phone] (CELL)  [place]

## 2016-12-22 NOTE — Progress Notes (Signed)
Called and left VM message for pt, did not leave results. Please try to call him later today.  Pathology report shows skin cancer. I have referred him to dermatology - he may need "Mohs" procedure, where they remove the area of involvement.  I asked him to f/u w me in one week, he does not need to come in -- as he will now be followed by derm. Thank you!

## 2016-12-22 NOTE — Addendum Note (Signed)
Addended by: Dorise Hiss on: 12/22/2016 02:07 PM   Modules accepted: Orders

## 2016-12-22 NOTE — Progress Notes (Signed)
Derm path shows SCC in situ. Derm referral for possible Mohs

## 2016-12-23 NOTE — Telephone Encounter (Signed)
Please call pt

## 2016-12-26 NOTE — Telephone Encounter (Signed)
fyi

## 2016-12-26 NOTE — Telephone Encounter (Signed)
Pt returning whitney call and would like to be called back   Please advise [phone]

## 2016-12-26 NOTE — Telephone Encounter (Signed)
Pt would like a CB. Il informed  him  that Whitney had left him a VM, he would still like a CB. Please advise at [phone]

## 2016-12-26 NOTE — Telephone Encounter (Signed)
Thank you. Called pt. Left vm message.

## 2016-12-28 ENCOUNTER — Ambulatory Visit: Payer: 59

## 2017-01-19 ENCOUNTER — Encounter: Payer: Self-pay | Admitting: Physician Assistant

## 2017-01-19 DIAGNOSIS — D0439 Carcinoma in situ of skin of other parts of face: Secondary | ICD-10-CM | POA: Diagnosis not present

## 2017-03-01 DIAGNOSIS — S0100XD Unspecified open wound of scalp, subsequent encounter: Secondary | ICD-10-CM | POA: Diagnosis not present

## 2017-04-03 DIAGNOSIS — Z85828 Personal history of other malignant neoplasm of skin: Secondary | ICD-10-CM | POA: Diagnosis not present

## 2017-04-03 DIAGNOSIS — L821 Other seborrheic keratosis: Secondary | ICD-10-CM | POA: Diagnosis not present

## 2017-04-03 DIAGNOSIS — D1801 Hemangioma of skin and subcutaneous tissue: Secondary | ICD-10-CM | POA: Diagnosis not present

## 2017-04-03 DIAGNOSIS — L570 Actinic keratosis: Secondary | ICD-10-CM | POA: Diagnosis not present

## 2017-04-03 DIAGNOSIS — S0100XA Unspecified open wound of scalp, initial encounter: Secondary | ICD-10-CM | POA: Diagnosis not present

## 2017-04-03 DIAGNOSIS — C44619 Basal cell carcinoma of skin of left upper limb, including shoulder: Secondary | ICD-10-CM | POA: Diagnosis not present

## 2017-04-03 DIAGNOSIS — C4442 Squamous cell carcinoma of skin of scalp and neck: Secondary | ICD-10-CM | POA: Diagnosis not present

## 2017-04-03 DIAGNOSIS — D485 Neoplasm of uncertain behavior of skin: Secondary | ICD-10-CM | POA: Diagnosis not present

## 2017-04-11 DIAGNOSIS — C44619 Basal cell carcinoma of skin of left upper limb, including shoulder: Secondary | ICD-10-CM | POA: Diagnosis not present

## 2017-04-11 DIAGNOSIS — D044 Carcinoma in situ of skin of scalp and neck: Secondary | ICD-10-CM | POA: Diagnosis not present

## 2017-05-17 DIAGNOSIS — L570 Actinic keratosis: Secondary | ICD-10-CM | POA: Diagnosis not present

## 2017-05-23 DIAGNOSIS — C44329 Squamous cell carcinoma of skin of other parts of face: Secondary | ICD-10-CM | POA: Diagnosis not present

## 2017-07-03 ENCOUNTER — Encounter: Payer: Self-pay | Admitting: Physician Assistant

## 2017-07-03 ENCOUNTER — Ambulatory Visit (INDEPENDENT_AMBULATORY_CARE_PROVIDER_SITE_OTHER): Payer: 59 | Admitting: Physician Assistant

## 2017-07-03 ENCOUNTER — Ambulatory Visit (INDEPENDENT_AMBULATORY_CARE_PROVIDER_SITE_OTHER): Payer: 59

## 2017-07-03 VITALS — BP 133/75 | HR 76 | Temp 97.4°F | Resp 17 | Ht 71.5 in | Wt 223.0 lb

## 2017-07-03 DIAGNOSIS — M25512 Pain in left shoulder: Secondary | ICD-10-CM | POA: Diagnosis not present

## 2017-07-03 DIAGNOSIS — M25612 Stiffness of left shoulder, not elsewhere classified: Secondary | ICD-10-CM

## 2017-07-03 DIAGNOSIS — S43102A Unspecified dislocation of left acromioclavicular joint, initial encounter: Secondary | ICD-10-CM | POA: Diagnosis not present

## 2017-07-03 MED ORDER — HYDROCODONE-ACETAMINOPHEN 7.5-325 MG PO TABS: 1.0000 | ORAL_TABLET | Freq: Four times a day (QID) | ORAL | 0 refills | Status: DC | PRN

## 2017-07-03 MED ORDER — MELOXICAM 15 MG PO TABS
15.0000 mg | ORAL_TABLET | Freq: Every day | ORAL | 1 refills | Status: DC
Start: 2017-07-03 — End: 2019-10-09

## 2017-07-03 MED ORDER — CYCLOBENZAPRINE HCL 10 MG PO TABS: 10.0000 mg | ORAL_TABLET | Freq: Three times a day (TID) | ORAL | 0 refills | Status: DC | PRN

## 2017-07-03 NOTE — Patient Instructions (Addendum)
Take Norco as prescribed. This is a controlled pain medication. Do not take this with your Flexeril. Start taking your flexeril as needed after your Norco.  Meloxicam is an antiinflammatory - Do not use with any other otc pain medication other than tylenol/acetaminophen - so no aleve, ibuprofen, motrin, advil, etc. You may take this with Flexeril or Norco.  Use heat and/or ice as needed 3-4 times daily for pain and swelling. You will receive a call from ortho to schedule your appt.  [facility] [address] [place] [phone]   Thank you for coming in today. I hope you feel we met your needs.  Feel free to call [facility] if you have any questions or further requests.  Please consider signing up for MyChart if you do not already have it, as this is a great way to communicate with me.  Best,  Whitney McVey, PA-C   IF you received an x-ray today, you will receive an invoice from [facility] Radiology. Please contact [facility] Radiology at [phone] with questions or concerns regarding your invoice.   IF you received labwork today, you will receive an invoice from [place]. Please contact [facility] at [phone] with questions or concerns regarding your invoice.   Our billing staff will not be able to assist you with questions regarding bills from these companies.  You will be contacted with the lab results as soon as they are available. The fastest way to get your results is to activate your My Chart account. Instructions are located on the last page of this paperwork. If you have not heard from [place] regarding the results in 2 weeks, please contact this office.

## 2017-07-03 NOTE — Progress Notes (Signed)
Jesse Mckee  MRN: [id number] DOB: 1956-12-06  PCP: Patient, No Pcp Per  Subjective:  Pt is a pleasant 61 year old male who presents to clinic for left shoulder pain x 2 days. He was at work yesterday, pushing a big plastic crate when he felt immediate pain to his left shoulder. Describes the feeling like a "hot flame" in left shoulder. 9/10 pain.  Endorses reduced range of motion and mild numbness and tingling down arm.  He is using kinesiology tape and compression - this is helping some.   H/o left shoulder problems. He injured rotator cuff several years ago and was a candidate for surgery however surgeon said he could not repair it fully because "the muscle was like shredded wheat."  Review of Systems  Musculoskeletal: Positive for arthralgias (L shoulder), joint swelling (L shoulder) and myalgias (L shoulder). Negative for neck pain.  Neurological: Positive for weakness. Negative for numbness.  Psychiatric/Behavioral: Positive for sleep disturbance.    Patient Active Problem List   Diagnosis Date Noted  . History of alcoholism ([place]) 05/28/2015    No current outpatient prescriptions on file prior to visit.   No current facility-administered medications on file prior to visit.     No Known Allergies   Objective:  BP 133/75   Pulse 76   Temp (!) 97.4 F (36.3 C) (Oral)   Resp 17   Ht 5' 11.5" (1.816 m)   Wt 223 lb (101.2 kg)   SpO2 98%   BMI 30.67 kg/m   Physical Exam  Constitutional: He is oriented to person, place, and time and well-developed, well-nourished, and in no distress. No distress.  Cardiovascular: Normal rate, regular rhythm and normal heart sounds.   Musculoskeletal:  Decreased passive ROM. Unable to abduct left arm laterally beyond 20 degrees. Unable to abduct arm at 45 degree angle past 45 degrees. Pain with pronation and supination.  TTP AC joint, anterior and posterior proximal humerus. Fluctuant mass medial to [facility] joint.   Neurological: He is alert  and oriented to person, place, and time. GCS score is 15.  Skin: Skin is warm and dry.  Psychiatric: Mood, memory, affect and judgment normal.  Vitals reviewed.  Dg Ac Joints  Result Date: 07/03/2017 CLINICAL DATA:  Left shoulder pain for 2 days.  No reported injury. EXAM: LEFT ACROMIOCLAVICULAR JOINTS COMPARISON:  None. FINDINGS: The left acromioclavicular joint space measures 8 mm both with and without weights. No fracture. No left acromioclavicular joint dislocation. No suspicious focal osseous lesion. No significant arthropathy. No pathologic soft tissue calcifications. IMPRESSION: No fracture. Mild widening of the left acromioclavicular joint, with no dynamic instability with weights, cannot exclude a grade 1 left AC joint separation. Electronically Signed   By: Ilona Sorrel M.D.   On: 07/03/2017 12:22   Dg Shoulder Left  Result Date: 07/03/2017 CLINICAL DATA:  Left shoulder pain for 2 days. Pushing/twisting injury. EXAM: LEFT SHOULDER - 2+ VIEW COMPARISON:  None. FINDINGS: No fracture. No dislocation at the left glenohumeral joint. Mild widening of the left acromioclavicular joint (7 mm). No suspicious focal osseous lesions. No significant arthropathy. No pathologic soft tissue calcifications. IMPRESSION: No fracture. No left glenohumeral joint dislocation. Mild left AC joint widening, cannot exclude a low-grade left acromioclavicular separation. No significant degenerative arthropathy. Electronically Signed   By: Ilona Sorrel M.D.   On: 07/03/2017 12:24    Assessment and Plan :  1. Pain in joint of left shoulder 2. Separation of left acromioclavicular joint, initial encounter 3.  Decreased range of motion of left shoulder - DG AC Joints; Future - DG Shoulder Left; Future - cyclobenzaprine (FLEXERIL) 10 MG tablet; Take 1 tablet (10 mg total) by mouth 3 (three) times daily as needed for muscle spasms.  Dispense: 30 tablet; Refill: 0 - meloxicam (MOBIC) 15 MG tablet; Take 1 tablet (15 mg  total) by mouth daily.  Dispense: 30 tablet; Refill: 1 - HYDROcodone-acetaminophen (NORCO) 7.5-325 MG tablet; Take 1 tablet by mouth every 6 (six) hours as needed.  Dispense: 30 tablet; Refill: 0 - Ambulatory referral to Orthopedic Surgery - Suspect acute on chronic rotator cuff injury. Significant reduced ROM and pain. Will refer to ortho for evaluation. Work note written to stay out of work until ortho eval. Medication side effects and safety considerations discussed with pt. He agrees with plan.   Mercer Pod, PA-C  [facility] [place] 07/03/2017 11:21 AM

## 2017-07-05 DIAGNOSIS — M12812 Other specific arthropathies, not elsewhere classified, left shoulder: Secondary | ICD-10-CM | POA: Diagnosis not present

## 2018-02-26 DIAGNOSIS — L821 Other seborrheic keratosis: Secondary | ICD-10-CM | POA: Diagnosis not present

## 2018-02-26 DIAGNOSIS — L570 Actinic keratosis: Secondary | ICD-10-CM | POA: Diagnosis not present

## 2018-02-26 DIAGNOSIS — D1801 Hemangioma of skin and subcutaneous tissue: Secondary | ICD-10-CM | POA: Diagnosis not present

## 2018-02-26 DIAGNOSIS — D225 Melanocytic nevi of trunk: Secondary | ICD-10-CM | POA: Diagnosis not present

## 2018-03-27 ENCOUNTER — Encounter: Payer: Self-pay | Admitting: Physician Assistant

## 2018-03-27 ENCOUNTER — Ambulatory Visit: Payer: 59 | Admitting: Physician Assistant

## 2018-03-27 ENCOUNTER — Other Ambulatory Visit: Payer: Self-pay

## 2018-03-27 VITALS — BP 130/72 | HR 75 | Temp 97.8°F | Ht 72.0 in | Wt 216.2 lb

## 2018-03-27 DIAGNOSIS — R059 Cough, unspecified: Secondary | ICD-10-CM

## 2018-03-27 DIAGNOSIS — R0981 Nasal congestion: Secondary | ICD-10-CM | POA: Diagnosis not present

## 2018-03-27 DIAGNOSIS — R197 Diarrhea, unspecified: Secondary | ICD-10-CM

## 2018-03-27 DIAGNOSIS — R05 Cough: Secondary | ICD-10-CM | POA: Diagnosis not present

## 2018-03-27 DIAGNOSIS — B349 Viral infection, unspecified: Secondary | ICD-10-CM | POA: Diagnosis not present

## 2018-03-27 MED ORDER — IPRATROPIUM BROMIDE 0.03 % NA SOLN: 2.0000 | Freq: Two times a day (BID) | NASAL | 0 refills | Status: DC

## 2018-03-27 MED ORDER — SUCRALFATE 1 G PO TABS: 1.0000 g | ORAL_TABLET | Freq: Three times a day (TID) | ORAL | 0 refills | Status: DC

## 2018-03-27 MED ORDER — BENZONATATE 100 MG PO CAPS: 100.0000 mg | ORAL_CAPSULE | Freq: Three times a day (TID) | ORAL | 0 refills | Status: DC | PRN

## 2018-03-27 NOTE — Patient Instructions (Addendum)
Continue antihistamine daily. Try Xyzal. Continue taking Mucinex.   Atrovent nasal spray 2x/day for the next 5-7 days.   Tessalon pearls for cough.   carafate is for your stomach.  See below for food choices for diarrhea - follow this for the next week.   Food Choices to Help Relieve Diarrhea, Adult When you have diarrhea, the foods you eat and your eating habits are very important. Choosing the right foods and drinks can help:  Relieve diarrhea.  Replace lost fluids and nutrients.  Prevent dehydration.  What general guidelines should I follow? Relieving diarrhea  Choose foods with less than 2 g or .07 oz. of fiber per serving.  Limit fats to less than 8 tsp (38 g or 1.34 oz.) a day.  Avoid the following: ? Foods and beverages sweetened with high-fructose corn syrup, honey, or sugar alcohols such as xylitol, sorbitol, and mannitol. ? Foods that contain a lot of fat or sugar. ? Fried, greasy, or spicy foods. ? High-fiber grains, breads, and cereals. ? Raw fruits and vegetables.  Eat foods that are rich in probiotics. These foods include dairy products such as yogurt and fermented milk products. They help increase healthy bacteria in the stomach and intestines (gastrointestinal tract, or GI tract).  If you have lactose intolerance, avoid dairy products. These may make your diarrhea worse.  Take medicine to help stop diarrhea (antidiarrheal medicine) only as told by your health care provider. Replacing nutrients  Eat small meals or snacks every 3-4 hours.  Eat bland foods, such as white rice, toast, or baked potato, until your diarrhea starts to get better. Gradually reintroduce nutrient-rich foods as tolerated or as told by your health care provider. This includes: ? Well-cooked protein foods. ? Peeled, seeded, and soft-cooked fruits and vegetables. ? Low-fat dairy products.  Take vitamin and mineral supplements as told by your health care provider. Preventing  dehydration   Start by sipping water or a special solution to prevent dehydration (oral rehydration solution, ORS). Urine that is clear or pale yellow means that you are getting enough fluid.  Try to drink at least 8-10 cups of fluid each day to help replace lost fluids.  You may add other liquids in addition to water, such as clear juice or decaffeinated sports drinks, as tolerated or as told by your health care provider.  Avoid drinks with caffeine, such as coffee, tea, or soft drinks.  Avoid alcohol. What foods are recommended? The items listed may not be a complete list. Talk with your health care provider about what dietary choices are best for you. Grains White rice. White, [place], or pita breads (fresh or toasted), including plain rolls, buns, or bagels. White pasta. Saltine, soda, or graham crackers. Pretzels. Low-fiber cereal. Cooked cereals made with water (such as cornmeal, farina, or cream cereals). Plain muffins. Matzo. Melba toast. Zwieback. Vegetables Potatoes (without the skin). Most well-cooked and canned vegetables without skins or seeds. Tender lettuce. Fruits Apple sauce. Fruits canned in juice. Cooked apricots, cherries, grapefruit, peaches, pears, or plums. Fresh bananas and cantaloupe. Meats and other protein foods Baked or boiled chicken. Eggs. Tofu. Fish. Seafood. Smooth nut butters. Ground or well-cooked tender beef, ham, veal, lamb, pork, or poultry. Dairy Plain yogurt, kefir, and unsweetened liquid yogurt. Lactose-free milk, buttermilk, skim milk, or soy milk. Low-fat or nonfat hard cheese. Beverages Water. Low-calorie sports drinks. Fruit juices without pulp. Strained tomato and vegetable juices. Decaffeinated teas. Sugar-free beverages not sweetened with sugar alcohols. Oral rehydration solutions, if approved by  your health care provider. Seasoning and other foods Bouillon, broth, or soups made from recommended foods. What foods are not recommended? The  items listed may not be a complete list. Talk with your health care provider about what dietary choices are best for you. Grains Whole grain, whole wheat, bran, or rye breads, rolls, pastas, and crackers. Wild or brown rice. Whole grain or bran cereals. Barley. Oats and oatmeal. Corn tortillas or taco shells. Granola. Popcorn. Vegetables Raw vegetables. Fried vegetables. Cabbage, broccoli, Brussels sprouts, artichokes, baked beans, beet greens, corn, kale, legumes, peas, sweet potatoes, and yams. Potato skins. Cooked spinach and cabbage. Fruits Dried fruit, including raisins and dates. Raw fruits. Stewed or dried prunes. Canned fruits with syrup. Meat and other protein foods Fried or fatty meats. Deli meats. Chunky nut butters. Nuts and seeds. Beans and lentils. Berniece Salines. Hot dogs. Sausage. Dairy High-fat cheeses. Whole milk, chocolate milk, and beverages made with milk, such as milk shakes. Half-and-half. Cream. sour cream. Ice cream. Beverages Caffeinated beverages (such as coffee, tea, soda, or energy drinks). Alcoholic beverages. Fruit juices with pulp. Prune juice. Soft drinks sweetened with high-fructose corn syrup or sugar alcohols. High-calorie sports drinks. Fats and oils Butter. Cream sauces. Margarine. Salad oils. Plain salad dressings. Olives. Avocados. Mayonnaise. Sweets and desserts Sweet rolls, doughnuts, and sweet breads. Sugar-free desserts sweetened with sugar alcohols such as xylitol and sorbitol. Seasoning and other foods Honey. Hot sauce. Chili powder. Gravy. Cream-based or milk-based soups. Pancakes and waffles. Summary  When you have diarrhea, the foods you eat and your eating habits are very important.  Make sure you get at least 8-10 cups of fluid each day, or enough to keep your urine clear or pale yellow.  Eat bland foods and gradually reintroduce healthy, nutrient-rich foods as tolerated, or as told by your health care provider.  Avoid high-fiber, fried, greasy, or  spicy foods. This information is not intended to replace advice given to you by your health care provider. Make sure you discuss any questions you have with your health care provider. Document Released: 02/24/2004 Document Revised: 12/01/2016 Document Reviewed: 12/01/2016 Elsevier Interactive Patient Education  2018 [employer].   IF you received an x-ray today, you will receive an invoice from [facility]. Please contact [facility] at [phone] with questions or concerns regarding your invoice.   IF you received labwork today, you will receive an invoice from [facility]. Please contact [facility] at [phone] with questions or concerns regarding your invoice.   Our billing staff will not be able to assist you with questions regarding bills from these companies.  You will be contacted with the lab results as soon as they are available. The fastest way to get your results is to activate your My Chart account. Instructions are located on the last page of this paperwork. If you have not heard from [place] regarding the results in 2 weeks, please contact this office.

## 2018-03-27 NOTE — Progress Notes (Signed)
Jesse Mckee  MRN: [id number] DOB: Nov 08, 1956  PCP: Patient, No Pcp Per  Subjective:  Pt is a pleasant 62 year old male who presents to clinic for stomach ache and chest congestion x 4-5 days.   Diarrhea x 4 days- Watery, liquidy stool. No blood or mucus.  pepto bismol makes it better. Day one he had >4 episodes of diarrhea. Yesterday he had about 2-3 episodes of diarrhea. None today. He cannot eat much "it just churns up my stomach".   Cough x 4 days. Also c/o nasal drainage. He has been sleeping a lot in the last two days. He is taking daily antihistamine. Denies fever, chills, shob, night sweats, n/v.  Review of Systems  Constitutional: Positive for fatigue. Negative for chills, diaphoresis and fever.  HENT: Positive for congestion, postnasal drip and rhinorrhea. Negative for sinus pressure, sinus pain, sneezing and sore throat.   Respiratory: Positive for cough. Negative for shortness of breath and wheezing.   Gastrointestinal: Positive for abdominal pain and diarrhea.    Patient Active Problem List   Diagnosis Date Noted  . History of alcoholism ([place]) 05/28/2015    Current Outpatient Medications on File Prior to Visit  Medication Sig Dispense Refill  . cyclobenzaprine (FLEXERIL) 10 MG tablet Take 1 tablet (10 mg total) by mouth 3 (three) times daily as needed for muscle spasms. (Patient not taking: Reported on 03/27/2018) 30 tablet 0  . HYDROcodone-acetaminophen (NORCO) 7.5-325 MG tablet Take 1 tablet by mouth every 6 (six) hours as needed. (Patient not taking: Reported on 03/27/2018) 30 tablet 0  . meloxicam (MOBIC) 15 MG tablet Take 1 tablet (15 mg total) by mouth daily. (Patient not taking: Reported on 03/27/2018) 30 tablet 1   No current facility-administered medications on file prior to visit.     No Known Allergies   Objective:  BP 130/72   Pulse 75   Temp 97.8 F (36.6 C) (Oral)   Ht 6' (1.829 m)   Wt 216 lb 3.2 oz (98.1 kg)   SpO2 96%   BMI 29.32 kg/m    Physical Exam  Constitutional: He is oriented to person, place, and time. He appears well-developed and well-nourished.  HENT:  Right Ear: Tympanic membrane normal.  Left Ear: Tympanic membrane normal.  Mouth/Throat: Oropharynx is clear and moist and mucous membranes are normal.  Cardiovascular: Normal rate and regular rhythm.  Pulmonary/Chest: Effort normal. No respiratory distress. He has no wheezes. He has no rhonchi. He has no rales.  Abdominal: Soft. Normal appearance. Bowel sounds are increased. There is generalized tenderness (mild). There is no rigidity and no guarding.  Neurological: He is alert and oriented to person, place, and time.  Skin: Skin is warm and dry.  Psychiatric: He has a normal mood and affect. His behavior is normal. Judgment and thought content normal.  Vitals reviewed.   Assessment and Plan :  1. Viral illness - suspect flu vs other URI vs viral gastroenteritis. Vitals are stable. No concerning findings on PE. Plan to treat supportively. RTC in 5-7 days if no improvement.  2. Diarrhea, unspecified type - sucralfate (CARAFATE) 1 g tablet; Take 1 tablet (1 g total) by mouth 4 (four) times daily -  with meals and at bedtime.  Dispense: 30 tablet; Refill: 0  3. Nasal congestion - ipratropium (ATROVENT) 0.03 % nasal spray; Place 2 sprays into both nostrils 2 (two) times daily.  Dispense: 30 mL; Refill: 0  4. Cough - benzonatate (TESSALON) 100 MG capsule; Take 1-2 capsules (  100-200 mg total) by mouth 3 (three) times daily as needed for cough.  Dispense: 40 capsule; Refill: 0   Mercer Pod, PA-C  [facility] 03/27/2018 11:07 AM

## 2018-07-19 DIAGNOSIS — D225 Melanocytic nevi of trunk: Secondary | ICD-10-CM | POA: Diagnosis not present

## 2018-07-19 DIAGNOSIS — L739 Follicular disorder, unspecified: Secondary | ICD-10-CM | POA: Diagnosis not present

## 2018-07-19 DIAGNOSIS — C4441 Basal cell carcinoma of skin of scalp and neck: Secondary | ICD-10-CM | POA: Diagnosis not present

## 2018-07-19 DIAGNOSIS — C44519 Basal cell carcinoma of skin of other part of trunk: Secondary | ICD-10-CM | POA: Diagnosis not present

## 2018-07-19 DIAGNOSIS — D485 Neoplasm of uncertain behavior of skin: Secondary | ICD-10-CM | POA: Diagnosis not present

## 2018-07-19 DIAGNOSIS — C4442 Squamous cell carcinoma of skin of scalp and neck: Secondary | ICD-10-CM | POA: Diagnosis not present

## 2018-07-19 DIAGNOSIS — L814 Other melanin hyperpigmentation: Secondary | ICD-10-CM | POA: Diagnosis not present

## 2018-09-19 DIAGNOSIS — C44329 Squamous cell carcinoma of skin of other parts of face: Secondary | ICD-10-CM | POA: Diagnosis not present

## 2018-10-11 DIAGNOSIS — C44519 Basal cell carcinoma of skin of other part of trunk: Secondary | ICD-10-CM | POA: Diagnosis not present

## 2018-10-24 DIAGNOSIS — C4441 Basal cell carcinoma of skin of scalp and neck: Secondary | ICD-10-CM | POA: Diagnosis not present

## 2018-10-25 ENCOUNTER — Encounter: Payer: Self-pay | Admitting: Physician Assistant

## 2018-10-25 DIAGNOSIS — C4491 Basal cell carcinoma of skin, unspecified: Secondary | ICD-10-CM | POA: Insufficient documentation

## 2018-10-25 HISTORY — DX: Basal cell carcinoma of skin, unspecified: C44.91

## 2018-11-04 DIAGNOSIS — C44311 Basal cell carcinoma of skin of nose: Secondary | ICD-10-CM | POA: Diagnosis not present

## 2019-01-24 DIAGNOSIS — C44519 Basal cell carcinoma of skin of other part of trunk: Secondary | ICD-10-CM | POA: Diagnosis not present

## 2019-01-24 DIAGNOSIS — L570 Actinic keratosis: Secondary | ICD-10-CM | POA: Diagnosis not present

## 2019-01-24 DIAGNOSIS — L814 Other melanin hyperpigmentation: Secondary | ICD-10-CM | POA: Diagnosis not present

## 2019-01-24 DIAGNOSIS — D485 Neoplasm of uncertain behavior of skin: Secondary | ICD-10-CM | POA: Diagnosis not present

## 2019-01-24 DIAGNOSIS — D225 Melanocytic nevi of trunk: Secondary | ICD-10-CM | POA: Diagnosis not present

## 2019-02-07 DIAGNOSIS — L905 Scar conditions and fibrosis of skin: Secondary | ICD-10-CM | POA: Diagnosis not present

## 2019-02-07 DIAGNOSIS — C44519 Basal cell carcinoma of skin of other part of trunk: Secondary | ICD-10-CM | POA: Diagnosis not present

## 2019-02-12 ENCOUNTER — Encounter: Payer: Self-pay | Admitting: Physician Assistant

## 2019-06-11 ENCOUNTER — Telehealth: Payer: Self-pay

## 2019-06-13 NOTE — Telephone Encounter (Signed)
Called and spoke with pt letting him know that Jesse Mckee was not at office today, 6/26 but stated to him once she returned to office next week that she would give him a call. Pt verbalized understanding.

## 2019-06-17 ENCOUNTER — Other Ambulatory Visit: Payer: Self-pay | Admitting: *Deleted

## 2019-06-17 DIAGNOSIS — Z122 Encounter for screening for malignant neoplasm of respiratory organs: Secondary | ICD-10-CM

## 2019-06-17 DIAGNOSIS — F17210 Nicotine dependence, cigarettes, uncomplicated: Secondary | ICD-10-CM

## 2019-06-17 NOTE — Telephone Encounter (Signed)
Jesse Mckee is in the office today and plans to contact patient.

## 2019-06-17 NOTE — Telephone Encounter (Signed)
LMTC  X 1  

## 2019-06-17 NOTE — Telephone Encounter (Signed)
Spoke with pt and scheduled [facility] 07/31/19 9:00 CT ordered Nothing further needed.

## 2019-07-29 ENCOUNTER — Telehealth: Payer: Self-pay | Admitting: Acute Care

## 2019-07-30 NOTE — Telephone Encounter (Signed)
Spoke with pt and rescheduled [facility] 08/13/19 11:00 CT rescheduled  Nothing further needed

## 2019-07-31 ENCOUNTER — Encounter: Payer: 59 | Admitting: Acute Care

## 2019-07-31 ENCOUNTER — Inpatient Hospital Stay: Admission: RE | Admit: 2019-07-31 | Payer: 59 | Source: Ambulatory Visit

## 2019-08-13 ENCOUNTER — Ambulatory Visit: Payer: 59 | Admitting: Acute Care

## 2019-08-13 ENCOUNTER — Other Ambulatory Visit: Payer: Self-pay

## 2019-08-13 ENCOUNTER — Encounter: Payer: Self-pay | Admitting: Acute Care

## 2019-08-13 ENCOUNTER — Ambulatory Visit (INDEPENDENT_AMBULATORY_CARE_PROVIDER_SITE_OTHER)
Admission: RE | Admit: 2019-08-13 | Discharge: 2019-08-13 | Disposition: A | Discharge status: Home / Self Care | Payer: 59 | Source: Ambulatory Visit | Attending: Acute Care | Admitting: Acute Care

## 2019-08-13 VITALS — BP 130/62 | HR 81 | Temp 98.3°F | Ht 70.0 in | Wt 214.0 lb

## 2019-08-13 DIAGNOSIS — F17210 Nicotine dependence, cigarettes, uncomplicated: Secondary | ICD-10-CM | POA: Diagnosis not present

## 2019-08-13 DIAGNOSIS — Z122 Encounter for screening for malignant neoplasm of respiratory organs: Secondary | ICD-10-CM

## 2019-08-13 NOTE — Progress Notes (Signed)
Shared Decision Making Visit Lung Cancer Screening Program [phone])   Eligibility:  Age 63 y.o.  Pack Years Smoking History Calculation 49 pack year smoking  (# packs/per year x # years smoked)  Recent History of coughing up blood  no  Unexplained weight loss? no ( >Than 15 pounds within the last 6 months )  Prior History Lung / other cancer no (Diagnosis within the last 5 years already requiring surveillance chest CT Scans).  Smoking Status Current Smoker  Former Smokers: Years since quit: NA   Quit Date: NA  Visit Components:  Discussion included one or more decision making aids. yes  Discussion included risk/benefits of screening. yes  Discussion included potential follow up diagnostic testing for abnormal scans. yes  Discussion included meaning and risk of over diagnosis. yes  Discussion included meaning and risk of False Positives. yes  Discussion included meaning of total radiation exposure. yes  Counseling Included:  Importance of adherence to annual lung cancer LDCT screening. yes  Impact of comorbidities on ability to participate in the program. yes  Ability and willingness to under diagnostic treatment. yes  Smoking Cessation Counseling:  Current Smokers:   Discussed importance of smoking cessation. yes  Information about tobacco cessation classes and interventions provided to patient. yes  Patient provided with "ticket" for LDCT Scan. yes  Symptomatic Patient. no  Counseling NA   Diagnosis Code: Tobacco Use Z72.0  Asymptomatic Patient yes  Counseling (Intermediate counseling: > three minutes counseling) [id number]  Former Smokers:   Discussed the importance of maintaining cigarette abstinence. yes  Diagnosis Code: Personal History of Nicotine Dependence. [id number]  Information about tobacco cessation classes and interventions provided to patient. Yes  Patient provided with "ticket" for LDCT Scan. yes  Written Order for Lung Cancer Screening  with LDCT placed in Epic. Yes (CT Chest Lung Cancer Screening Low Dose W/O CM) [id number] Z12.2-Screening of respiratory organs Z87.891-Personal history of nicotine dependence  BP 130/62 (BP Location: Right Arm)   Pulse 81   Temp 98.3 F (36.8 C) (Oral)   Ht 5\' 10"  (1.778 m)   Wt 214 lb (97.1 kg)   SpO2 96%   BMI 30.71 kg/m   I have spent 25 minutes of face to face time with Jesse Mckee discussing the risks and benefits of lung cancer screening. We viewed a power point together that explained in detail the above noted topics. We paused at intervals to allow for questions to be asked and answered to ensure understanding.We discussed that the single most powerful action that he can take to decrease his risk of developing lung cancer is to quit smoking. We discussed whether or not he is ready to commit to setting a quit date. We discussed options for tools to aid in quitting smoking including nicotine replacement therapy, non-nicotine medications, support groups, Quit Smart classes, and behavior modification. We discussed that often times setting smaller, more achievable goals, such as eliminating 1 cigarette a day for a week and then 2 cigarettes a day for a week can be helpful in slowly decreasing the number of cigarettes smoked. This allows for a sense of accomplishment as well as providing a clinical benefit. I gave him the " Be Stronger Than Your Excuses" card with contact information for community resources, classes, free nicotine replacement therapy, and access to mobile apps, text messaging, and on-line smoking cessation help. I have also given him my card and contact information in the event he needs to contact me. We discussed the time and location  of the scan, and that either Jesse Glassman RN or I will call with the results within 24-48 hours of receiving them. I have offered him  a copy of the power point we viewed  as a resource in the event they need reinforcement of the concepts we discussed  today in the office. The patient verbalized understanding of all of  the above and had no further questions upon leaving the office. They have my contact information in the event they have any further questions.  I spent 5 minutes counseling on smoking cessation and the health risks of continued tobacco abuse.  I explained to the patient that there has been a high incidence of coronary artery disease noted on these exams. I explained that this is a non-gated exam therefore degree or severity cannot be determined. This patient is not currently on statin therapy. I have asked the patient to follow-up with their PCP regarding any incidental finding of coronary artery disease and management with diet or medication as their PCP  feels is clinically indicated. The patient verbalized understanding of the above and had no further questions upon completion of the visit.      Magdalen Spatz, NP 08/13/2019 11:21 AM

## 2019-08-13 NOTE — Patient Instructions (Signed)
Thank you for participating in the [facility]. It was our pleasure to meet you today. We will call you with the results of your scan within the next few days. Your scan will be assigned a Lung RADS category score by the physicians reading the scans.  This Lung RADS score determines follow up scanning.  See below for description of categories, and follow up screening recommendations. We will be in touch to schedule your follow up screening annually or based on recommendations of our providers. We will fax a copy of your scan results to your Primary Care Physician, or the physician who referred you to the program, to ensure they have the results. Please call the office if you have any questions or concerns regarding your scanning experience or results.  Our office number is [phone]. Please speak with Denise Phelps, RN. She is our Lung Cancer Screening RN. If she is unavailable when you call, please have the office staff send her a message. She will return your call at her earliest convenience. Remember, if your scan is normal, we will scan you annually as long as you continue to meet the criteria for the program. (Age 55-77, Current smoker or smoker who has quit within the last 15 years). If you are a smoker, remember, quitting is the single most powerful action that you can take to decrease your risk of lung cancer and other pulmonary, breathing related problems. We know quitting is hard, and we are here to help.  Please let us know if there is anything we can do to help you meet your goal of quitting. If you are a former smoker, congratulations. We are proud of you! Remain smoke free! Remember you can refer friends or family members through the number above.  We will screen them to make sure they meet criteria for the program. Thank you for helping us take better care of you by participating in Lung Screening.  Lung RADS Categories:  Lung RADS 1: no nodules  or definitely non-concerning nodules.  Recommendation is for a repeat annual scan in 12 months.  Lung RADS 2:  nodules that are non-concerning in appearance and behavior with a very low likelihood of becoming an active cancer. Recommendation is for a repeat annual scan in 12 months.  Lung RADS 3: nodules that are probably non-concerning , includes nodules with a low likelihood of becoming an active cancer.  Recommendation is for a 6-month repeat screening scan. Often noted after an upper respiratory illness. We will be in touch to make sure you have no questions, and to schedule your 6-month scan.  Lung RADS 4 A: nodules with concerning findings, recommendation is most often for a follow up scan in 3 months or additional testing based on our provider's assessment of the scan. We will be in touch to make sure you have no questions and to schedule the recommended 3 month follow up scan.  Lung RADS 4 B:  indicates findings that are concerning. We will be in touch with you to schedule additional diagnostic testing based on our provider's  assessment of the scan.   

## 2019-08-15 ENCOUNTER — Other Ambulatory Visit: Payer: Self-pay | Admitting: *Deleted

## 2019-08-15 DIAGNOSIS — Z122 Encounter for screening for malignant neoplasm of respiratory organs: Secondary | ICD-10-CM

## 2019-08-15 DIAGNOSIS — F17210 Nicotine dependence, cigarettes, uncomplicated: Secondary | ICD-10-CM

## 2019-09-03 ENCOUNTER — Telehealth: Payer: Self-pay

## 2019-09-03 NOTE — Telephone Encounter (Signed)
Left message for patient to call the office to schedule a office visit.

## 2019-10-08 NOTE — Progress Notes (Signed)
Cardiology Office Note:   Date:  10/09/2019  NAME:  Jesse Mckee    MRN: [id number] DOB:  [birth year]   PCP:  Marda Stalker, PA-C  Cardiologist:  Evalina Field, MD  Electrophysiologist:  None   Referring MD: Marda Stalker, PA-C   Chief Complaint  Patient presents with  . Coronary Artery Disease    History of Present Illness:   Jesse Mckee is a 63 y.o. male with a hx of tobacco abuse, diabetes who is being seen today for the evaluation of aortic valve calcification seen on lung cancer screening CT at the request of Marda Stalker, PA-C.  He recently underwent a lung cancer screening CT he was noted to have trace calcification on the aortic valve as well as mild calcification in the LAD distribution.  He was sent in for further evaluation.  He reports no symptoms of chest pain or trouble breathing with exertion.  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  He is a current smoker and smokes only 1 pack/day.  He smoked for nearly 50 years.  He is trying to quit.  It appears to be a stressful event for him and he is unable to get down to 0 cigarettes/day.  He describes no symptoms of claudication and has exceptional pulses in the lower extremities.  Review of lab work from his primary care physician show that he is diabetic with an A1c of 8.6.  He is not on a statin and his LDL cholesterol is 111.  He is married to Ms. Whitney (a patient of mine who works at [employer]).  Past Medical History: Past Medical History:  Diagnosis Date  . Basal cell carcinoma (BCC) 10/25/2018    Past Surgical History: History reviewed. No pertinent surgical history.  Current Medications: No outpatient medications have been marked as taking for the 10/09/19 encounter (Office Visit) with Geralynn Rile, MD.     Allergies:    Patient has no known allergies.   Social History: Social History   Socioeconomic  History  . Marital status: Married    Spouse name: Not on file  . Number of children: Not on file  . Years of education: Not on file  . Highest education level: Not on file  Occupational History  . Not on file  Social Needs  . Financial resource strain: Not on file  . Food insecurity    Worry: Not on file    Inability: Not on file  . Transportation needs    Medical: Not on file    Non-medical: Not on file  Tobacco Use  . Smoking status: Current Every Day Smoker    Packs/day: 1.00    Years: 49.00    Pack years: 49.00    Types: Cigarettes  . Smokeless tobacco: Never Used  Substance and Sexual Activity  . Alcohol use: No    Alcohol/week: 0.0 standard drinks  . Drug use: No  . Sexual activity: Not on file  Lifestyle  . Physical activity    Days per week: Not on file    Minutes per session: Not on file  . Stress: Not on file  Relationships  . Social Herbalist on phone: Not on file    Gets together: Not on file    Attends religious service: Not on file    Active member of club or organization: Not on file  Attends meetings of clubs or organizations: Not on file    Relationship status: Not on file  Other Topics Concern  . Not on file  Social History Narrative  . Not on file     Family History: The patient's family history includes Cancer in his brother and mother.  ROS:   All other ROS reviewed and negative. Pertinent positives noted in the HPI.     EKGs/Labs/Other Studies Reviewed:   The following studies were personally reviewed by me today:  EKG:  EKG is ordered today.  The ekg ordered today demonstrates normal sinus rhythm, heart rate 66, normal intervals, no acute ischemic changes, no evidence of prior infarction, and was personally reviewed by me.   I personally reviewed the CT scan which shows trace aortic root calcification likely not on the valve, and mild coronary artery calcifications.  He also was noted to have mild aortic calcifications in  the arch.  TTE 08/13/2019 1. Lung-RADS 2S, benign appearance or behavior. Continue annual screening with low-dose chest CT without contrast in 12 months. 2. The "S" modifier above refers to potentially clinically significant non lung cancer related findings. Specifically, there is aortic atherosclerosis, in addition to 2 vessel coronary artery disease. Please note that although the presence of coronary artery calcium documents the presence of coronary artery disease, the severity of this disease and any potential stenosis cannot be assessed on this non-gated CT examination. Assessment for potential risk factor modification, dietary therapy or pharmacologic therapy may be warranted, if clinically indicated. 3. Mild diffuse bronchial wall thickening with mild centrilobular and paraseptal emphysema; imaging findings suggestive of underlying COPD. 4. There are calcifications of the aortic valve. Echocardiographic correlation for evaluation of potential valvular dysfunction may be warranted if clinically indicated.  Recent Labs: No results found for requested labs within last 8760 hours.   Recent Lipid Panel No results found for: CHOL, TRIG, HDL, CHOLHDL, VLDL, LDLCALC, LDLDIRECT  Physical Exam:   VS:  BP 126/60   Pulse 66   Ht 5\' 11"  (1.803 m)   Wt 211 lb 6.4 oz (95.9 kg)   BMI 29.48 kg/m    Wt Readings from Last 3 Encounters:  10/09/19 211 lb 6.4 oz (95.9 kg)  08/13/19 214 lb (97.1 kg)  03/27/18 216 lb 3.2 oz (98.1 kg)    General: Well nourished, well developed, in no acute distress Heart: Atraumatic, normal size  Eyes: PEERLA, EOMI  Neck: Supple, no JVD Endocrine: No thryomegaly Cardiac: Faint 1 out of 6 systolic ejection murmur Lungs: Clear to auscultation bilaterally, no wheezing, rhonchi or rales  Abd: Soft, nontender, no hepatomegaly  Ext: No edema, pulses 2+ Musculoskeletal: No deformities, BUE and BLE strength normal and equal Skin: Warm and dry, no rashes    Neuro: Alert and oriented to person, place, time, and situation, CNII-XII grossly intact, no focal deficits  Psych: Normal mood and affect   ASSESSMENT:   Jesse Mckee is a 63 y.o. male who presents for the following: 1. Coronary artery disease involving native coronary artery of native heart without angina pectoris   2. Mixed hyperlipidemia   3. Murmur   4. Tobacco abuse   5. Type 2 diabetes mellitus with complication, without long-term current use of insulin ([place])     PLAN:   1. Coronary artery disease involving native coronary artery of native heart without angina pectoris 2. Mixed hyperlipidemia -Coronary calcification seen on CT scan for lung cancer screening.  He has no symptoms of angina or symptoms to  suggest underlying obstructive CAD.  He is diabetic, and with his coronary calcium I would like for him to start an aspirin and Crestor 10 mg daily.  Most recent LDL cholesterol is 111 and I would like it less than 70 as he is diabetic.  We will also recheck an A1c today and refill his metformin.  His A1c was 8.6 and I hope this is improved with Metformin therapy. -He has no symptoms of claudication suggest he has underlying PAD despite heavy smoking history -Advised to quit smoking -We will see him back in 3 months and recheck a lipid profile  3. Murmur -Trace aortic valve calcification and faint murmur on exam we will proceed with echocardiogram.  I suspect this will be aortic valve sclerosis.  4. Tobacco abuse -Counseled on the importance of smoking cessation today  5. Diabetes -Most recent A1c 8.6 and on metformin -Refill Metformin today and will recheck an A1c   Disposition: Return in about 3 months (around 01/09/2020).  Medication Adjustments/Labs and Tests Ordered: Current medicines are reviewed at length with the patient today.  Concerns regarding medicines are outlined above.  Orders Placed This Encounter  Procedures  . HgB A1c  . EKG 12-Lead  . ECHOCARDIOGRAM  COMPLETE   Meds ordered this encounter  Medications  . aspirin EC 81 MG tablet    Sig: Take 1 tablet (81 mg total) by mouth daily.    Dispense:  90 tablet    Refill:  3  . metFORMIN (GLUCOPHAGE) 500 MG tablet    Sig: Take 1 tablet (500 mg total) by mouth 2 (two) times daily with a meal.    Dispense:  180 tablet    Refill:  0  . rosuvastatin (CRESTOR) 10 MG tablet    Sig: Take 1 tablet (10 mg total) by mouth daily.    Dispense:  90 tablet    Refill:  3    Patient Instructions  Medication Instructions:  START ASPIRIN 81 MG ONCE DAILY  START ROSUVASTATIN 10 MG ONCE DAILY  RESTART METFORMIN 500 MG TWICE DAILY  *If you need a refill on your cardiac medications before your next appointment, please call your pharmacy*  Lab Work: Your physician recommends that you WILL HAVE LAB WORK IN 3 MONTHS  If you have labs (blood work) drawn today and your tests are completely normal, you will receive your results only by: Marland Kitchen MyChart Message (if you have MyChart) OR . A paper copy in the mail If you have any lab test that is abnormal or we need to change your treatment, we will call you to review the results.  Testing/Procedures: Your physician has requested that you have an echocardiogram. Echocardiography is a painless test that uses sound waves to create images of your heart. It provides your doctor with information about the size and shape of your heart and how well your heart's chambers and valves are working. This procedure takes approximately one hour. There are no restrictions for this procedure.[place]    Follow-Up: At [facility], you and your health needs are our priority.  As part of our continuing mission to provide you with exceptional heart care, we have created designated Provider Care Teams.  These Care Teams include your primary Cardiologist (physician) and Advanced Practice Providers (APPs -  Physician Assistants and Nurse Practitioners) who all work  together to provide you with the care you need, when you need it.   Your next appointment:   3 months  The  format for your next appointment:   In Person  Provider:   Eleonore Chiquito, MD      Signed, Addison Naegeli. Audie Box, [place]  [address], [place], [place] [address] [phone]  10/09/2019 9:15 AM

## 2019-10-09 ENCOUNTER — Ambulatory Visit: Payer: 59 | Admitting: Cardiovascular Disease

## 2019-10-09 ENCOUNTER — Other Ambulatory Visit: Payer: Self-pay

## 2019-10-09 ENCOUNTER — Encounter: Payer: Self-pay | Admitting: Cardiovascular Disease

## 2019-10-09 VITALS — BP 126/60 | HR 66 | Ht 71.0 in | Wt 211.4 lb

## 2019-10-09 DIAGNOSIS — Z720 Tobacco use: Secondary | ICD-10-CM

## 2019-10-09 DIAGNOSIS — E782 Mixed hyperlipidemia: Secondary | ICD-10-CM

## 2019-10-09 DIAGNOSIS — I2510 Atherosclerotic heart disease of native coronary artery without angina pectoris: Secondary | ICD-10-CM

## 2019-10-09 DIAGNOSIS — R011 Cardiac murmur, unspecified: Secondary | ICD-10-CM

## 2019-10-09 DIAGNOSIS — E118 Type 2 diabetes mellitus with unspecified complications: Secondary | ICD-10-CM

## 2019-10-09 MED ORDER — ASPIRIN EC 81 MG PO TBEC: 81.0000 mg | DELAYED_RELEASE_TABLET | Freq: Every day | ORAL | 3 refills | Status: AC

## 2019-10-09 MED ORDER — ROSUVASTATIN CALCIUM 10 MG PO TABS: 10.0000 mg | ORAL_TABLET | Freq: Every day | ORAL | 3 refills | Status: AC

## 2019-10-09 MED ORDER — METFORMIN HCL 500 MG PO TABS: 500.0000 mg | ORAL_TABLET | Freq: Two times a day (BID) | ORAL | 0 refills | Status: AC

## 2019-10-09 NOTE — Patient Instructions (Addendum)
Medication Instructions:  START ASPIRIN 81 MG ONCE DAILY  START ROSUVASTATIN 10 MG ONCE DAILY  RESTART METFORMIN 500 MG TWICE DAILY  *If you need a refill on your cardiac medications before your next appointment, please call your pharmacy*  Lab Work: Your physician recommends that you WILL HAVE LAB WORK IN 3 MONTHS  If you have labs (blood work) drawn today and your tests are completely normal, you will receive your results only by: Marland Kitchen MyChart Message (if you have MyChart) OR . A paper copy in the mail If you have any lab test that is abnormal or we need to change your treatment, we will call you to review the results.  Testing/Procedures: Your physician has requested that you have an echocardiogram. Echocardiography is a painless test that uses sound waves to create images of your heart. It provides your doctor with information about the size and shape of your heart and how well your heart's chambers and valves are working. This procedure takes approximately one hour. There are no restrictions for this procedure.[place]    Follow-Up: At [facility], you and your health needs are our priority.  As part of our continuing mission to provide you with exceptional heart care, we have created designated Provider Care Teams.  These Care Teams include your primary Cardiologist (physician) and Advanced Practice Providers (APPs -  Physician Assistants and Nurse Practitioners) who all work together to provide you with the care you need, when you need it.   Your next appointment:   3 months  The format for your next appointment:   In Person  Provider:   Eleonore Chiquito, MD

## 2019-10-10 LAB — HEMOGLOBIN A1C
Est. average glucose Bld gHb Est-mCnc: 148 mg/dL
Hgb A1c MFr Bld: 6.8 % — ABNORMAL HIGH (ref 4.8–5.6)

## 2019-10-16 ENCOUNTER — Other Ambulatory Visit: Payer: Self-pay

## 2019-10-16 ENCOUNTER — Ambulatory Visit (HOSPITAL_COMMUNITY): Payer: 59 | Attending: Cardiovascular Disease

## 2019-10-16 DIAGNOSIS — I2510 Atherosclerotic heart disease of native coronary artery without angina pectoris: Secondary | ICD-10-CM | POA: Insufficient documentation

## 2019-10-17 ENCOUNTER — Telehealth: Payer: Self-pay | Admitting: Cardiovascular Disease

## 2019-10-17 DIAGNOSIS — R931 Abnormal findings on diagnostic imaging of heart and coronary circulation: Secondary | ICD-10-CM

## 2019-10-17 NOTE — Addendum Note (Signed)
Addended by: Raiford Simmonds on: 10/17/2019 01:06 PM   Modules accepted: Orders

## 2019-10-17 NOTE — Telephone Encounter (Signed)
Attempted to call Mr. Mallo about echo. Normal EF. Asc Ao up to 4.3 cm. Need to get a CTA chest to look at entire aorta. Tricuspid AoV on my review. Left message for him to all [place] back.   [place] T. Audie Box, [place]  [address], [place], [place] [address] [phone]  12:45 PM

## 2019-10-17 NOTE — Telephone Encounter (Addendum)
Patient return called . Information  Given - aware needs another test to give more information of aorta. Patient aware and agrees to further testing  ordered and BMP

## 2019-10-30 LAB — BASIC METABOLIC PANEL
BUN/Creatinine Ratio: 18 (ref 10–24)
BUN: 13 mg/dL (ref 8–27)
CO2: 22 mmol/L (ref 20–29)
Calcium: 9.4 mg/dL (ref 8.6–10.2)
Chloride: 104 mmol/L (ref 96–106)
Creatinine, Ser: 0.73 mg/dL — ABNORMAL LOW (ref 0.76–1.27)
GFR calc Af Amer: 114 mL/min/{1.73_m2} (ref >59)
GFR calc non Af Amer: 99 mL/min/{1.73_m2} (ref >59)
Glucose: 153 mg/dL — ABNORMAL HIGH (ref 65–99)
Potassium: 4.4 mmol/L (ref 3.5–5.2)
Sodium: 142 mmol/L (ref 134–144)

## 2019-11-06 ENCOUNTER — Ambulatory Visit (INDEPENDENT_AMBULATORY_CARE_PROVIDER_SITE_OTHER)
Admission: RE | Admit: 2019-11-06 | Discharge: 2019-11-06 | Disposition: A | Discharge status: Home / Self Care | Payer: 59 | Source: Ambulatory Visit | Attending: Cardiovascular Disease | Admitting: Cardiovascular Disease

## 2019-11-06 ENCOUNTER — Other Ambulatory Visit: Payer: Self-pay

## 2019-11-06 DIAGNOSIS — R931 Abnormal findings on diagnostic imaging of heart and coronary circulation: Secondary | ICD-10-CM | POA: Diagnosis not present

## 2019-11-06 MED ORDER — IOHEXOL 350 MG/ML SOLN
100.0000 mL | Freq: Once | INTRAVENOUS | Status: AC | PRN
  Administered 2019-11-06: 100 mL via INTRAVENOUS

## 2019-11-07 ENCOUNTER — Telehealth: Payer: Self-pay | Admitting: Cardiovascular Disease

## 2019-11-07 NOTE — Telephone Encounter (Signed)
Called Mr. Nidiffer. Asc Ao 4.3 cm. Matches echo. Will follow with echo yearly. Informed him of A1c 6.8. Overall, doing ok.   [place] T. Audie Box, [place]  [address] [place], [place] [address] [phone]  12:05 PM

## 2019-12-02 ENCOUNTER — Other Ambulatory Visit (HOSPITAL_BASED_OUTPATIENT_CLINIC_OR_DEPARTMENT_OTHER): Payer: Self-pay | Admitting: Family Medicine

## 2019-12-02 DIAGNOSIS — R109 Unspecified abdominal pain: Secondary | ICD-10-CM

## 2019-12-03 ENCOUNTER — Ambulatory Visit (HOSPITAL_BASED_OUTPATIENT_CLINIC_OR_DEPARTMENT_OTHER)
Admission: RE | Admit: 2019-12-03 | Discharge: 2019-12-03 | Disposition: A | Discharge status: Home / Self Care | Payer: 59 | Source: Ambulatory Visit | Attending: Family Medicine | Admitting: Family Medicine

## 2019-12-03 ENCOUNTER — Other Ambulatory Visit: Payer: Self-pay

## 2019-12-03 DIAGNOSIS — R109 Unspecified abdominal pain: Secondary | ICD-10-CM | POA: Diagnosis not present

## 2019-12-15 ENCOUNTER — Other Ambulatory Visit: Payer: Self-pay | Admitting: Gastroenterology

## 2019-12-15 DIAGNOSIS — R109 Unspecified abdominal pain: Secondary | ICD-10-CM

## 2019-12-18 ENCOUNTER — Other Ambulatory Visit: Payer: Self-pay

## 2019-12-18 ENCOUNTER — Ambulatory Visit
Admission: RE | Admit: 2019-12-18 | Discharge: 2019-12-18 | Disposition: A | Discharge status: Home / Self Care | Payer: 59 | Source: Ambulatory Visit | Attending: Gastroenterology | Admitting: Gastroenterology

## 2019-12-18 DIAGNOSIS — R109 Unspecified abdominal pain: Secondary | ICD-10-CM

## 2019-12-18 MED ORDER — IOPAMIDOL (ISOVUE-300) INJECTION 61%
100.0000 mL | Freq: Once | INTRAVENOUS | Status: AC | PRN
  Administered 2019-12-18: 100 mL via INTRAVENOUS

## 2019-12-23 ENCOUNTER — Other Ambulatory Visit: Payer: Self-pay

## 2019-12-23 ENCOUNTER — Encounter (HOSPITAL_BASED_OUTPATIENT_CLINIC_OR_DEPARTMENT_OTHER): Payer: Self-pay | Admitting: Emergency Medicine

## 2019-12-23 ENCOUNTER — Emergency Department (HOSPITAL_BASED_OUTPATIENT_CLINIC_OR_DEPARTMENT_OTHER)
Admission: EM | Admit: 2019-12-23 | Discharge: 2019-12-23 | Disposition: A | Discharge status: Home / Self Care | Payer: 59 | Attending: Emergency Medicine | Admitting: Emergency Medicine

## 2019-12-23 ENCOUNTER — Emergency Department (HOSPITAL_BASED_OUTPATIENT_CLINIC_OR_DEPARTMENT_OTHER): Payer: 59

## 2019-12-23 DIAGNOSIS — R1084 Generalized abdominal pain: Secondary | ICD-10-CM

## 2019-12-23 DIAGNOSIS — F17210 Nicotine dependence, cigarettes, uncomplicated: Secondary | ICD-10-CM | POA: Insufficient documentation

## 2019-12-23 DIAGNOSIS — Z7982 Long term (current) use of aspirin: Secondary | ICD-10-CM | POA: Diagnosis not present

## 2019-12-23 DIAGNOSIS — Z79899 Other long term (current) drug therapy: Secondary | ICD-10-CM | POA: Insufficient documentation

## 2019-12-23 DIAGNOSIS — E119 Type 2 diabetes mellitus without complications: Secondary | ICD-10-CM | POA: Diagnosis not present

## 2019-12-23 DIAGNOSIS — R109 Unspecified abdominal pain: Secondary | ICD-10-CM | POA: Diagnosis present

## 2019-12-23 DIAGNOSIS — Z7984 Long term (current) use of oral hypoglycemic drugs: Secondary | ICD-10-CM | POA: Diagnosis not present

## 2019-12-23 HISTORY — DX: Type 2 diabetes mellitus without complications: E11.9

## 2019-12-23 LAB — URINALYSIS, ROUTINE W REFLEX MICROSCOPIC
Bilirubin Urine: NEGATIVE
Glucose, UA: NEGATIVE mg/dL
Hgb urine dipstick: NEGATIVE
Ketones, ur: 15 mg/dL — AB
Leukocytes,Ua: NEGATIVE
Nitrite: NEGATIVE
Protein, ur: NEGATIVE mg/dL
Specific Gravity, Urine: 1.025 (ref 1.005–1.030)
pH: 5.5 (ref 5.0–8.0)

## 2019-12-23 LAB — CBC
HCT: 48.2 % (ref 39.0–52.0)
Hemoglobin: 16.6 g/dL (ref 13.0–17.0)
MCH: 30.9 pg (ref 26.0–34.0)
MCHC: 34.4 g/dL (ref 30.0–36.0)
MCV: 89.6 fL (ref 80.0–100.0)
Platelets: 222 10*3/uL (ref 150–400)
RBC: 5.38 MIL/uL (ref 4.22–5.81)
RDW: 13.5 % (ref 11.5–15.5)
WBC: 14.2 10*3/uL — ABNORMAL HIGH (ref 4.0–10.5)
nRBC: 0 % (ref 0.0–0.2)

## 2019-12-23 LAB — COMPREHENSIVE METABOLIC PANEL
ALT: 20 U/L (ref 0–44)
AST: 15 U/L (ref 15–41)
Albumin: 4.5 g/dL (ref 3.5–5.0)
Alkaline Phosphatase: 73 U/L (ref 38–126)
Anion gap: 11 (ref 5–15)
BUN: 13 mg/dL (ref 8–23)
CO2: 24 mmol/L (ref 22–32)
Calcium: 9.4 mg/dL (ref 8.9–10.3)
Chloride: 101 mmol/L (ref 98–111)
Creatinine, Ser: 0.73 mg/dL (ref 0.61–1.24)
GFR calc Af Amer: >60 mL/min (ref >60)
GFR calc non Af Amer: >60 mL/min (ref >60)
Glucose, Bld: 212 mg/dL — ABNORMAL HIGH (ref 70–99)
Potassium: 4 mmol/L (ref 3.5–5.1)
Sodium: 136 mmol/L (ref 135–145)
Total Bilirubin: 0.9 mg/dL (ref 0.3–1.2)
Total Protein: 7.6 g/dL (ref 6.5–8.1)

## 2019-12-23 LAB — LIPASE, BLOOD: Lipase: 44 U/L (ref 11–51)

## 2019-12-23 MED ORDER — IOHEXOL 350 MG/ML SOLN
100.0000 mL | Freq: Once | INTRAVENOUS | Status: AC | PRN
  Administered 2019-12-23: 100 mL via INTRAVENOUS

## 2019-12-23 MED ORDER — HYDROCODONE-ACETAMINOPHEN 5-325 MG PO TABS: 1.0000 | ORAL_TABLET | ORAL | 0 refills | Status: DC | PRN

## 2019-12-23 NOTE — ED Provider Notes (Signed)
[place] EMERGENCY DEPARTMENT Provider Note   CSN: [id number] Arrival date & time: 12/23/19  [id number]     History Chief Complaint  Patient presents with  . Abdominal Pain    Jesse Mckee is a 64 y.o. male who presents to the ED today for gradual onset, constant, unchanged, sharp, right flank pain/right sided abdominal pain x 3 weeks. Pt reports he was sent here from his PCP's office due to findings of pyelonephritis on a recent CT scan. He reports that he was seen by his PCP 3 weeks ago for this same pain and diagnosed with H. Pylori infection and started on medications (pt cannot recall name of meds). He states that he was sent to [facility] for further evaluation who changed his medication. He then had a CT A/P done on 12/31 which showed findings of:  1. Ill-defined areas of decreased parenchymal enhancement in right kidney, consistent with pyelonephritis. No evidence of hydronephrosis. Recommend follow-up by CT in 2-3 months to confirm resolution. 2. Hepatic cirrhosis and findings of portal venous hypertension. No evidence of hepatic neoplasm. 3. Stable mildly enlarged prostate.  Ultrasound 12/03/19 with findings of: 1. No acute findings. 2. Mild splenomegaly.  Pt reports [facility] told him to follow up with his PCP given they do not specialize in kidneys and he reports he was told by his PCP to come to the ED for further evaluation. Pt states he had emesis a couple of times when the pain first began but has not had any vomiting since then. He reports he has continued to be nauseated due to the pain. Pt states he is taking Excedrin without relief. No urinary symptoms or testicular pain/swelling. Pt is a current everyday smoker. No hx of A fib.   The history is provided by the patient and medical records.       Past Medical History:  Diagnosis Date  . Basal cell carcinoma (BCC) 10/25/2018  . Diabetes mellitus without complication [facility])     Patient Active Problem List   Diagnosis Date Noted  . Basal cell carcinoma (BCC) 10/25/2018  . History of alcoholism ([place]) 05/28/2015    History reviewed. No pertinent surgical history.     Family History  Problem Relation Age of Onset  . Cancer Mother   . Cancer Brother     Social History   Tobacco Use  . Smoking status: Current Every Day Smoker    Packs/day: 1.00    Years: 49.00    Pack years: 49.00    Types: Cigarettes  . Smokeless tobacco: Never Used  Substance Use Topics  . Alcohol use: No    Alcohol/week: 0.0 standard drinks  . Drug use: No    Home Medications Prior to Admission medications   Medication Sig Start Date End Date Taking? Authorizing Provider  aspirin EC 81 MG tablet Take 1 tablet (81 mg total) by mouth daily. 10/09/19   O'NealCassie Freer, MD  HYDROcodone-acetaminophen (NORCO/VICODIN) 5-325 MG tablet Take 1 tablet by mouth every 4 (four) hours as needed for severe pain. 12/23/19   Eustaquio Maize, PA-C  metFORMIN (GLUCOPHAGE) 500 MG tablet Take 1 tablet (500 mg total) by mouth 2 (two) times daily with a meal. 10/09/19   O'Neal, Cassie Freer, MD  rosuvastatin (CRESTOR) 10 MG tablet Take 1 tablet (10 mg total) by mouth daily. 10/09/19 01/07/20  O'NealCassie Freer, MD    Allergies    Patient has no known allergies.  Review of Systems   Review of  Systems  Constitutional: Negative for chills and fever.  Respiratory: Negative for shortness of breath.   Cardiovascular: Negative for chest pain.  Gastrointestinal: Positive for abdominal pain, nausea and vomiting (resolved). Negative for blood in stool, constipation and diarrhea.  Genitourinary: Positive for flank pain. Negative for difficulty urinating, dysuria, frequency, hematuria, penile swelling, scrotal swelling and testicular pain.  All other systems reviewed and are negative.   Physical Exam Updated Vital Signs BP (!) 148/77 (BP Location: Left Arm)   Pulse 83   Temp 98.1 F (36.7 C) (Oral)   Resp 18   Ht 5\' 11"   (1.803 m)   Wt 95.3 kg   SpO2 98%   BMI 29.29 kg/m   Physical Exam Vitals and nursing note reviewed.  Constitutional:      Appearance: He is not ill-appearing or diaphoretic.  HENT:     Head: Normocephalic and atraumatic.  Eyes:     Conjunctiva/sclera: Conjunctivae normal.  Cardiovascular:     Rate and Rhythm: Normal rate and regular rhythm.  Pulmonary:     Effort: Pulmonary effort is normal.     Breath sounds: Normal breath sounds.  Abdominal:     Palpations: Abdomen is soft.     Tenderness: There is abdominal tenderness in the right upper quadrant and right lower quadrant. There is right CVA tenderness and guarding (voluntary). There is no left CVA tenderness or rebound.  Musculoskeletal:     Cervical back: Neck supple.  Skin:    General: Skin is warm and dry.  Neurological:     Mental Status: He is alert.     ED Results / Procedures / Treatments   Labs (all labs ordered are listed, but only abnormal results are displayed) Labs Reviewed  COMPREHENSIVE METABOLIC PANEL - Abnormal; Notable for the following components:      Result Value   Glucose, Bld 212 (*)    All other components within normal limits  CBC - Abnormal; Notable for the following components:   WBC 14.2 (*)    All other components within normal limits  URINALYSIS, ROUTINE W REFLEX MICROSCOPIC - Abnormal; Notable for the following components:   Ketones, ur 15 (*)    All other components within normal limits  LIPASE, BLOOD    EKG None  Radiology CT Angio Abd/Pel W and/or Wo Contrast  Result Date: 12/23/2019 CLINICAL DATA:  Abdominal pain and nausea since last week. Recent diagnosis of pyelonephritis. Evaluate for renal ischemia and/or infarction. EXAM: CTA ABDOMEN AND PELVIS WITHOUT AND WITH CONTRAST TECHNIQUE: Multidetector CT imaging of the abdomen and pelvis was performed using the standard protocol during bolus administration of intravenous contrast. Multiplanar reconstructed images and MIPs were  obtained and reviewed to evaluate the vascular anatomy. CONTRAST:  160mL OMNIPAQUE IOHEXOL 350 MG/ML SOLN COMPARISON:  CT abdomen pelvis-12/18/2019; 05/29/2015 FINDINGS: VASCULAR Aorta: There is a moderate amount of slightly irregular mixed calcified and noncalcified atherosclerotic plaque throughout the normal caliber abdominal aorta, not resulting in hemodynamically significant stenosis. Focal noncalcified intraluminal plaque is seen within the infrarenal abdominal aorta (image 22, series 4), not resulting in a hemodynamically significant stenosis. No periaortic stranding. Celiac: There is a minimal amount of calcified atherosclerotic plaque involving the origin the celiac artery, not resulting in a hemodynamically significant stenosis. Conventional branching pattern. SMA: There is a minimal amount mixed calcified and noncalcified atherosclerotic plaque involving the origin of the SMA. Eccentric crescentic shaped noncalcified plaque is seen within mid/distal aspect of the main trunk of the SMA (axial image 62,  series 4; sagittal image 88, series 9), approaching 50% luminal narrowing. The distal tributaries of the SMA appear widely patent without discrete intraluminal filling defect to suggest distal embolism. Renals: Duplicated bilaterally; there are accessory renal arteries supplying the inferior poles of the bilateral kidneys. Both dominant renal arteries are widely patent without hemodynamically significant narrowing. No vessel irregularity to suggest FMD. IMA: Widely patent without hemodynamically significant narrowing. Inflow: There is a moderate to large amount mixed calcified and noncalcified atherosclerotic plaque involving the proximal and mid aspects of the right external iliac artery (representative images 108 and 113, series 4), approaching tandem areas of 50% luminal narrowing. There is a moderate amount of eccentric mixed calcified and noncalcified atherosclerotic plaque involving the distal aspect  of the left external iliac artery (image 127, series 4), not definitely resulting in hemodynamically significant stenosis. The left common iliac artery is disease though without hemodynamically significant stenosis The bilateral internal iliac arteries are disease though patent and of normal caliber. Proximal Outflow: There is a minimal amount of eccentric mixed calcified and noncalcified atherosclerotic plaque involving the bilateral common and imaged portions of the bilateral superficial femoral arteries, not definitely resulting in hemodynamically significant stenosis. Veins: The IVC and pelvic venous systems appear widely patent on this arterial phase examination. Review of the MIP images confirms the above findings. _________________________________________________________ NON-VASCULAR Lower chest: Limited visualization of the lower thorax demonstrates minimal dependent subpleural ground-glass atelectasis. No pleural effusion. Normal heart size.  No pericardial effusion. Hepatobiliary: There is mild diffuse decreased attenuation hepatic parenchyma on this postcontrast examination. Mild nodularity hepatic contour. There is a punctate (approximately 0.7 cm) area of hyperenhancement involving left lobe of the liver (image 25, series 4), which is too small to accurately characterize. Otherwise, no discrete hyperenhancing hepatic lesions. Normal appearance of the gallbladder given degree of distention. No radiopaque gallstones. No intra or extrahepatic biliary ductal dilatation. No ascites. Pancreas: Normal appearance of the pancreas. Spleen: Normal appearance of the spleen. Adrenals/Urinary Tract: There is symmetric enhancement of the bilateral kidneys. No definite renal stones on this postcontrast examination as punctate calcification about the right renal pelvis is favored to be vascular in etiology. No discrete renal lesions. There is a minimal to moderate amount of grossly symmetric bilateral perinephric  stranding, similar to remote abdominal CT performed 05/29/2015. No evidence of urinary obstruction. Ill-defined area of subtle decreased perfusion involving the posterior interpolar aspect the right kidney is slightly improved compared to the 12/18/2019 examination (representative axial image 61, series 4; coronal image 77, series 6). There are no wedge shaped areas of decreased perfusion or wedge-shaped atrophy involving either kidney to suggest acute or chronic infarction. Normal appearance the bilateral adrenal glands. Normal appearance of the urinary bladder given degree of distention. Stomach/Bowel: Ingested enteric contrast extends to the level of the sigmoid colon. Scattered colonic diverticulosis without evidence of superimposed acute diverticulitis. Normal appearance of the terminal ileum and the retrocecal appendix. No pneumoperitoneum, pneumatosis or portal venous gas. Lymphatic: Scattered porta hepatis and retroperitoneal lymph nodes are numerous though individually not enlarged by size criteria. No bulky retroperitoneal, mesenteric, pelvic or inguinal lymphadenopathy. Reproductive: Borderline enlarged prostate gland. Scattered dystrophic calcifications within the prostate. Suspected bilateral hydroceles. No free fluid the pelvic cul-de-sac. Other: Tiny mesenteric fat containing bilateral inguinal hernias. Regional soft tissues appear normal. No acute or aggressive osseous abnormalities. Mild multilevel lumbar spine DDD, worse at L2-L3 with disc space height loss, endplate irregularity and sclerosis. Musculoskeletal: No acute or aggressive osseous abnormalities. IMPRESSION: VASCULAR 1. No  evidence of renal artery stenosis. Additionally, there is no evidence acute or chronic renal infarction. 2. Large amount of atherosclerotic plaque within a normal caliber abdominal aorta. Aortic Atherosclerosis (ICD10-I70.0). 3. Suspected 50% luminal narrowing involving the mid/distal aspect of the main trunk of the  SMA, however the distal tributaries of the SMA appear widely patent without discrete intraluminal filling defect to suggest distal embolism. Additionally, the celiac and IMA are both patent without hemodynamically significant stenosis. 4. Suspected tandem areas of 50% luminal narrowing involving the proximal aspect of the right external iliac artery. Correlation for right lower extremity PAD symptoms is advised. NON-VASCULAR 1. No evidence of acute or chronic renal infarction. 2. Subtle ill-defined hypoenhancement involving the posterior interpolar aspect of the right kidney is improved compared to the 12/18/2019 examination and again may represent a subtle area of pyelonephritis. 3. Symmetric likely age and body habitus related bilateral perinephric stranding without evidence of urinary obstruction, similar to the 05/2015 examination. 4. Prostatomegaly. If not recently performed, further evaluation with DRE is advised. 5. Suspected hepatic steatosis with mild nodularity hepatic contour as could be seen in the setting early cirrhotic change. Correlation with LFTs is advised. 6. Punctate (approximately 0.7 cm) hyperenhancing apparent lesion within the left lobe of the liver is too small to accurately characterize though given concern for underlying cirrhosis, a follow-up MRI in 6 months could be performed as clinically indicated. 7. Scattered colonic diverticulosis without evidence superimposed acute diverticulitis. Electronically Signed   By: Sandi Mariscal M.D.   On: 12/23/2019 14:31    Procedures Procedures (including critical care time)  Medications Ordered in ED Medications  iohexol (OMNIPAQUE) 350 MG/ML injection 100 mL (100 mLs Intravenous Contrast Given 12/23/19 1325)    ED Course  I have reviewed the triage vital signs and the nursing notes.  Pertinent labs & imaging results that were available during my care of the patient were reviewed by me and considered in my medical decision making (see chart  for details).  64 year old male who presents with 3-4 weeks of right flank and right sided abd pain with nausea. See HPI for previous work up. Reports he had a CT scan done which showed pyelo and he was told to come to the ED for further evaluation by his PCP. No urinary symptoms. On arrival vitals are stable; he is afebrile without tachycardia or tachypnea. Pt does have right CVA tenderness and RUQ/RLQ tenderness with voluntary guarding. Pt reacts with even light touch by my stethoscope on his abdomen. His urine today is without any signs of infection and he has no urinary symptoms to suggest findings of pyelo on his CT scan. Labwork was obtained prior to being seen - pt does have a leukocytosis of [id number] but otherwise unremarkable work up including no electrolyte abnormalities or increase in LFTs/lipase. Pt reports he had labwork done at PCP's this week with elevated WBC count as well; he is currently on therapy for H pylori infection. Given findings will obtain CTA a/p to rule out infarct that could be presenting as pyelo on CT scan. If no acute findings pt will be discharged with close PCP/GI follow up.   CTA a/p without acute findings. Again with urinalysis without signs of infection do not feel strongly that pt is exhibiting true pyelonephritis. Will have patient follow up outpatient with both PCP and GI. PDMP reviewed and pt without any recent narcotic pain medication; will discharge home with short course of Norco to take PRN. Strict return precautions have  been discussed. He is in agreement with plan at this time and stable for discharge home.   This note was prepared using Dragon voice recognition software and may include unintentional dictation errors due to the inherent limitations of voice recognition software.    MDM Rules/Calculators/A&P                       Final Clinical Impression(s) / ED Diagnoses Final diagnoses:  Generalized abdominal pain    Rx / DC Orders ED Discharge Orders          Ordered    HYDROcodone-acetaminophen (NORCO/VICODIN) 5-325 MG tablet  Every 4 hours PRN     12/23/19 1527           Discharge Instructions     Your labwork and CT scan did not show any acute changes today.  Please follow up with your PCP as well as your gastroenterologist for further evaluation of your abdominal pain.  I have prescribed a short course of pain medication for you to take AS NEEDED for severe pain.        Eustaquio Maize, PA-C 12/23/19 1656    Blanchie Dessert, MD 12/23/19 2018

## 2019-12-23 NOTE — ED Triage Notes (Signed)
Generalized abd pain x 3 days with nausea. Dx with H. Pylori and a kidney infection. Has been on abx without relief.

## 2019-12-23 NOTE — ED Notes (Signed)
Per pt he has ben dealing with abd pain x 1 month states he has seen several drs today saw a dr and his white count was elevated but  States it has been higher states ws told he may have a kidney infection

## 2019-12-23 NOTE — Discharge Instructions (Addendum)
Your labwork and CT scan did not show any acute changes today.  Please follow up with your PCP as well as your gastroenterologist for further evaluation of your abdominal pain.  I have prescribed a short course of pain medication for you to take AS NEEDED for severe pain.

## 2019-12-23 NOTE — ED Notes (Signed)
Pt verbalized understanding of dc instructions.

## 2020-01-08 NOTE — Progress Notes (Signed)
Cardiology Office Note:   Date:  01/09/2020  NAME:  Jesse Mckee    MRN: [id number] DOB:  [birth year]   PCP:  Marda Stalker, PA-C  Cardiologist:  Evalina Field, MD  Electrophysiologist:  None   Referring MD: Marda Stalker, PA-C   Chief Complaint  Patient presents with   Follow-up   History of Present Illness:   Jesse Mckee is a 64 y.o. male with a hx of DM, CAD, HLD, PAD, aortic dilation, tobacco abuse who presents for follow-up.  He was evaluated in October after noted to have aortic valve calcium on a CT lung screening protocol.  Also had coronary calcium.  No symptoms of angina or aortic stenosis.  Echo unremarkable.  He was noted to have a dilated aorta up to 4.3 cm.  He underwent a gated CTA of his chest which showed a 4.3 cm ascending aorta.  He has been to the emergency room a few times since her last visit and appears to been diagnosed with cirrhosis.  We did start him on aspirin and Crestor at his last visit.  He reports has been dealing with right upper quadrant abdominal pain for the last month or so.  This is resulted in emergency room visit.  He apparently is been diagnosed with H. pylori as well as pyelonephritis.  He is completed several courses of antibiotics for this.  He reports abdominal pain is gotten better but he still has chronic nausea.  He has plans to meet with gastroenterology on Tuesday to discuss possible EGD.  He is tolerating his Crestor well.  I did go over his recent scans with him.  He has a mildly dilated ascending aorta up to 4.3 cm.  We will plan to repeat an ultrasound in 1 year.  Echo and CT match well.  He was also found on the CTA of his abdomen pelvis to have significant PAD.  Up to 50% blockages bilaterally in the right iliacs.  He endorses no symptoms of claudication.  He does have poor pulses but reports he can walk several miles without any cramping sensation in his legs.  He does have poor veins but no wounds or issues with healing down  there.  He describes no chest pain or trouble breathing today.  Overall is doing well from a cardiovascular disease standpoint.  His most recent A1c was 6.8.  He is tolerating Crestor 10 mg well.  Plans for repeat lipid profile today.  He unfortunately is still smoking 1 pack/day.  He has been doing this for several years.  I have encouraged him to quit smoking.  Especially now that we know he has PAD.  No symptoms of chest pain or trouble rating to suggest underlying obstructive CAD.   Problem List 1. Diabetes (A1c 6.8) 2. CAD (calcium seen on chest CT) 3. Hyperlipidemia LDL 111 4. Ascending aortic dilation (4.3 cm CTA 10/2019) 5. Cirrhosis (Seen on chest CT) 6. PAD (50% narrowing R iliacs on CT) 7. Tobacco Abuse   Past Medical History: Past Medical History:  Diagnosis Date   Basal cell carcinoma (BCC) 10/25/2018   Diabetes mellitus without complication [facility])     Past Surgical History: History reviewed. No pertinent surgical history.  Current Medications: Current Meds  Medication Sig   aspirin EC 81 MG tablet Take 1 tablet (81 mg total) by mouth daily.   HYDROcodone-acetaminophen (NORCO/VICODIN) 5-325 MG tablet Take 1 tablet by mouth every 4 (four) hours as needed for severe pain.   metFORMIN (GLUCOPHAGE)  500 MG tablet Take 1 tablet (500 mg total) by mouth 2 (two) times daily with a meal.     Allergies:    Patient has no known allergies.   Social History: Social History   Socioeconomic History   Marital status: Married    Spouse name: Not on file   Number of children: Not on file   Years of education: Not on file   Highest education level: Not on file  Occupational History   Not on file  Tobacco Use   Smoking status: Current Every Day Smoker    Packs/day: 1.00    Years: 49.00    Pack years: 49.00    Types: Cigarettes   Smokeless tobacco: Never Used  Substance and Sexual Activity   Alcohol use: No    Alcohol/week: 0.0 standard drinks   Drug use: No    Sexual activity: Not on file  Other Topics Concern   Not on file  Social History Narrative   Not on file   Social Determinants of Health   Financial Resource Strain:    Difficulty of Paying Living Expenses: Not on file  Food Insecurity:    Worried About [place] in the Last Year: Not on file   Ran Out of Food in the Last Year: Not on file  Transportation Needs:    Lack of Transportation (Medical): Not on file   Lack of Transportation (Non-Medical): Not on file  Physical Activity:    Days of Exercise per Week: Not on file   Minutes of Exercise per Session: Not on file  Stress:    Feeling of Stress : Not on file  Social Connections:    Frequency of Communication with Friends and Family: Not on file   Frequency of Social Gatherings with Friends and Family: Not on file   Attends Religious Services: Not on file   Active Member of Clubs or Organizations: Not on file   Attends Archivist Meetings: Not on file   Marital Status: Not on file     Family History: The patient's family history includes Cancer in his brother and mother.  ROS:   All other ROS reviewed and negative. Pertinent positives noted in the HPI.     EKGs/Labs/Other Studies Reviewed:   The following studies were personally reviewed by me today:  TTE 10/16/2019  1. Left ventricular ejection fraction, by visual estimation, is 60 to 65%. The left ventricle has normal function. There is mildly increased left ventricular hypertrophy.  2. Left ventricular diastolic parameters are consistent with Grade I diastolic dysfunction (impaired relaxation).  3. Global right ventricle has normal systolic function.The right ventricular size is normal. No increase in right ventricular wall thickness.  4. Left atrial size was normal.  5. Right atrial size was normal.  6. The mitral valve is normal in structure. Trace mitral valve regurgitation.  7. The tricuspid valve is normal in structure.  Tricuspid valve regurgitation is trivial.  8. The aortic valve is normal in structure. Aortic valve regurgitation is not visualized. No evidence of aortic valve sclerosis or stenosis.  9. The pulmonic valve was normal in structure. Pulmonic valve regurgitation is not visualized. 10. Aortic dilatation noted. 11. There is mild to moderate dilatation of the ascending aorta measuring 43 mm. 12. The atrial septum is grossly normal. 13. The average left ventricular global longitudinal strain is -19.4 %.  CTA Chest 11/06/2019 1. Ascending thoracic aortic diameter measures 4.3 x 4.2 cm. No evident thoracic aortic dissection.  Scattered foci of great vessel and aortic atherosclerosis noted.  2.  No demonstrable pulmonary embolus.  3. No edema or consolidation. Scattered 1-3 mm nodular opacities noted within the lungs. No follow-up needed if patient is low-risk (and has no known or suspected primary neoplasm). Non-contrast chest CT can be considered in 12 months if patient is high-risk. This recommendation follows the consensus statement: Guidelines for Management of Incidental Pulmonary Nodules Detected on CT Images: From the Fleischner Society 2017; Radiology 2017; 284:228-243.  4.  No evident adenopathy.  Recent Labs: 12/23/2019: ALT 20; BUN 13; Creatinine, Ser 0.73; Hemoglobin 16.6; Platelets 222; Potassium 4.0; Sodium 136   Recent Lipid Panel No results found for: CHOL, TRIG, HDL, CHOLHDL, VLDL, LDLCALC, LDLDIRECT  Physical Exam:   VS:  BP 140/84    Pulse 79    Ht 5\' 11"  (1.803 m)    Wt 215 lb 6.4 oz (97.7 kg)    SpO2 99%    BMI 30.04 kg/m    Wt Readings from Last 3 Encounters:  01/09/20 215 lb 6.4 oz (97.7 kg)  12/23/19 210 lb (95.3 kg)  10/09/19 211 lb 6.4 oz (95.9 kg)    General: Well nourished, well developed, in no acute distress Heart: Atraumatic, normal size  Eyes: PEERLA, EOMI  Neck: Supple, no JVD Endocrine: No thryomegaly Cardiac: Normal S1, S2; RRR; no murmurs, rubs,  or gallops Lungs: Clear to auscultation bilaterally, no wheezing, rhonchi or rales  Abd: Soft, nontender, no hepatomegaly  Ext: No edema, diminished pulses bilaterally lower extremities Musculoskeletal: No deformities, BUE and BLE strength normal and equal Skin: Warm and dry, no rashes   Neuro: Alert and oriented to person, place, time, and situation, CNII-XII grossly intact, no focal deficits  Psych: Normal mood and affect   ASSESSMENT:   Jesse Mckee is a 64 y.o. male who presents for the following: 1. Mixed hyperlipidemia   2. Coronary artery disease involving native coronary artery of native heart without angina pectoris   3. Aortic ectasia (HCC)   4. PAD (peripheral artery disease) ([place])   5. Tobacco abuse   6. Right upper quadrant abdominal pain     PLAN:   1. Mixed hyperlipidemia -Most recent LDL was 111 -Recheck lipid profile today -His LDL goal is less than 70 given his PAD, CAD, diabetes -We will continue his Crestor for now  2. Coronary artery disease involving native coronary artery of native heart without angina pectoris -Coronary calcification seen on CT scan of chest -No symptoms of angina -Continue aspirin statin  3. Aortic ectasia (HCC) -4.3 cm on recent CTA chest -Matches echo -We will plan to repeat an imaging study likely echo in 1 year  4. PAD (peripheral artery disease) (HCC) -50% blockages seen on CT abdomen pelvis of the right iliac artery -We will plan to monitor for now.  He has no symptoms of claudication or any concerns for obstructive CAD at this time.  5. Tobacco abuse -Encouraged to stop smoking.  Still smoking considerably.  6. Right upper quadrant abdominal pain -He is been evaluated for extensive abdominal pain.  H. pylori positive.  Recent pyelonephritis.  He has plans to follow-up with gastroenterology.  He is asked [place] to recheck a CBC today.  He was concerned that his number was elevated in the emergency room.  We will check a CBC  today.  Disposition: Return in about 6 months (around 07/08/2020).  Medication Adjustments/Labs and Tests Ordered: Current medicines are reviewed at length with the patient today.  Concerns regarding medicines are outlined above.  Orders Placed This Encounter  Procedures   CBC   Lipid panel   No orders of the defined types were placed in this encounter.   Patient Instructions  Medication Instructions:  The current medical regimen is effective;  continue present plan and medications.  *If you need a refill on your cardiac medications before your next appointment, please call your pharmacy*  Lab Work: Lipid, CBC today  If you have labs (blood work) drawn today and your tests are completely normal, you will receive your results only by:  [facility] (if you have MyChart) OR  A paper copy in the mail If you have any lab test that is abnormal or we need to change your treatment, we will call you to review the results.  Follow-Up: At [facility], you and your health needs are our priority.  As part of our continuing mission to provide you with exceptional heart care, we have created designated Provider Care Teams.  These Care Teams include your primary Cardiologist (physician) and Advanced Practice Providers (APPs -  Physician Assistants and Nurse Practitioners) who all work together to provide you with the care you need, when you need it.  Your next appointment:   6 month(s)  The format for your next appointment:   Virtual Visit   Provider:   Eleonore Chiquito, MD      Signed, Addison Naegeli. Audie Box, [place]  [address] [place], [place] [address] [phone]  01/09/2020 8:17 AM

## 2020-01-09 ENCOUNTER — Ambulatory Visit: Payer: 59 | Admitting: Cardiovascular Disease

## 2020-01-09 ENCOUNTER — Other Ambulatory Visit: Payer: Self-pay

## 2020-01-09 ENCOUNTER — Encounter: Payer: Self-pay | Admitting: Cardiovascular Disease

## 2020-01-09 VITALS — BP 140/84 | HR 79 | Ht 71.0 in | Wt 215.4 lb

## 2020-01-09 DIAGNOSIS — Z720 Tobacco use: Secondary | ICD-10-CM

## 2020-01-09 DIAGNOSIS — I77819 Aortic ectasia, unspecified site: Secondary | ICD-10-CM | POA: Diagnosis not present

## 2020-01-09 DIAGNOSIS — E782 Mixed hyperlipidemia: Secondary | ICD-10-CM

## 2020-01-09 DIAGNOSIS — I739 Peripheral vascular disease, unspecified: Secondary | ICD-10-CM | POA: Diagnosis not present

## 2020-01-09 DIAGNOSIS — I2510 Atherosclerotic heart disease of native coronary artery without angina pectoris: Secondary | ICD-10-CM | POA: Diagnosis not present

## 2020-01-09 DIAGNOSIS — R1011 Right upper quadrant pain: Secondary | ICD-10-CM

## 2020-01-09 NOTE — Patient Instructions (Signed)
Medication Instructions:  The current medical regimen is effective;  continue present plan and medications.  *If you need a refill on your cardiac medications before your next appointment, please call your pharmacy*  Lab Work: Lipid, CBC today  If you have labs (blood work) drawn today and your tests are completely normal, you will receive your results only by: [facility] MyChart Message (if you have MyChart) OR . A paper copy in the mail If you have any lab test that is abnormal or we need to change your treatment, we will call you to review the results.  Follow-Up: At [facility], you and your health needs are our priority.  As part of our continuing mission to provide you with exceptional heart care, we have created designated Provider Care Teams.  These Care Teams include your primary Cardiologist (physician) and Advanced Practice Providers (APPs -  Physician Assistants and Nurse Practitioners) who all work together to provide you with the care you need, when you need it.  Your next appointment:   6 month(s)  The format for your next appointment:   Virtual Visit   Provider:   Eleonore Chiquito, MD

## 2020-01-10 LAB — CBC
Hematocrit: 48.1 % (ref 37.5–51.0)
Hemoglobin: 16.8 g/dL (ref 13.0–17.7)
MCH: 30.9 pg (ref 26.6–33.0)
MCHC: 34.9 g/dL (ref 31.5–35.7)
MCV: 89 fL (ref 79–97)
Platelets: 238 10*3/uL (ref 150–450)
RBC: 5.43 x10E6/uL (ref 4.14–5.80)
RDW: 13.1 % (ref 11.6–15.4)
WBC: 13.1 10*3/uL — ABNORMAL HIGH (ref 3.4–10.8)

## 2020-01-10 LAB — LIPID PANEL
Chol/HDL Ratio: 3 ratio (ref 0.0–5.0)
Cholesterol, Total: 97 mg/dL — ABNORMAL LOW (ref 100–199)
HDL: 32 mg/dL — ABNORMAL LOW (ref >39)
LDL Chol Calc (NIH): 42 mg/dL (ref 0–99)
Triglycerides: 129 mg/dL (ref 0–149)
VLDL Cholesterol Cal: 23 mg/dL (ref 5–40)

## 2020-01-26 ENCOUNTER — Other Ambulatory Visit (HOSPITAL_COMMUNITY): Payer: Self-pay | Admitting: Gastroenterology

## 2020-01-26 ENCOUNTER — Other Ambulatory Visit: Payer: Self-pay | Admitting: Gastroenterology

## 2020-01-26 DIAGNOSIS — R112 Nausea with vomiting, unspecified: Secondary | ICD-10-CM

## 2020-02-09 ENCOUNTER — Encounter (HOSPITAL_COMMUNITY): Payer: Self-pay

## 2020-02-09 ENCOUNTER — Encounter (HOSPITAL_COMMUNITY)
Admission: RE | Admit: 2020-02-09 | Discharge: 2020-02-09 | Disposition: A | Discharge status: Home / Self Care | Payer: 59 | Source: Ambulatory Visit | Attending: Gastroenterology | Admitting: Gastroenterology

## 2020-02-09 ENCOUNTER — Other Ambulatory Visit: Payer: Self-pay

## 2020-02-09 DIAGNOSIS — R112 Nausea with vomiting, unspecified: Secondary | ICD-10-CM

## 2020-02-16 ENCOUNTER — Telehealth: Payer: Self-pay | Admitting: Cardiovascular Disease

## 2020-02-16 NOTE — Telephone Encounter (Signed)
New message   Patient needs a new prescription for  metFORMIN (GLUCOPHAGE) 500 MG tablet sent to [place], [place]

## 2020-02-16 NOTE — Telephone Encounter (Signed)
Do you want to refill this? Or go to PCP?

## 2020-02-16 NOTE — Telephone Encounter (Signed)
Called patient, LVM advising to reach out to PCP office to refill Metformin. Left call back number if questions.

## 2020-02-16 NOTE — Telephone Encounter (Signed)
PCP. Viona Gilmore

## 2020-02-23 ENCOUNTER — Encounter (HOSPITAL_COMMUNITY)
Admission: RE | Admit: 2020-02-23 | Discharge: 2020-02-23 | Disposition: A | Discharge status: Home / Self Care | Payer: 59 | Source: Ambulatory Visit | Attending: Gastroenterology | Admitting: Gastroenterology

## 2020-02-23 ENCOUNTER — Other Ambulatory Visit: Payer: Self-pay

## 2020-02-23 DIAGNOSIS — R112 Nausea with vomiting, unspecified: Secondary | ICD-10-CM | POA: Diagnosis not present

## 2020-02-23 MED ORDER — TECHNETIUM TC 99M SULFUR COLLOID: 2.1600 | Freq: Once | INTRAVENOUS | Status: DC | PRN

## 2020-02-23 MED ORDER — TECHNETIUM TC 99M SULFUR COLLOID
2.1600 | Freq: Once | INTRAVENOUS | Status: AC | PRN
  Administered 2020-02-23: 2.16 via ORAL

## 2020-06-03 ENCOUNTER — Other Ambulatory Visit: Payer: Self-pay

## 2020-06-07 ENCOUNTER — Telehealth: Payer: Self-pay | Admitting: Family

## 2020-06-07 NOTE — Telephone Encounter (Signed)
lmom to inform pt of new referral appointment 7/19 at 130 pm

## 2020-06-18 ENCOUNTER — Other Ambulatory Visit: Payer: Self-pay | Admitting: Gastroenterology

## 2020-06-18 DIAGNOSIS — N12 Tubulo-interstitial nephritis, not specified as acute or chronic: Secondary | ICD-10-CM

## 2020-07-02 ENCOUNTER — Other Ambulatory Visit: Payer: Self-pay | Admitting: Family

## 2020-07-02 DIAGNOSIS — D729 Disorder of white blood cells, unspecified: Secondary | ICD-10-CM

## 2020-07-05 ENCOUNTER — Inpatient Hospital Stay: Payer: 59

## 2020-07-05 ENCOUNTER — Other Ambulatory Visit: Payer: Self-pay

## 2020-07-05 ENCOUNTER — Inpatient Hospital Stay: Payer: 59 | Attending: Family | Admitting: Family

## 2020-07-05 ENCOUNTER — Encounter: Payer: Self-pay | Admitting: Family

## 2020-07-05 VITALS — BP 131/78 | HR 77 | Temp 98.1°F | Resp 20 | Ht 71.0 in | Wt 209.4 lb

## 2020-07-05 DIAGNOSIS — G4730 Sleep apnea, unspecified: Secondary | ICD-10-CM | POA: Insufficient documentation

## 2020-07-05 DIAGNOSIS — Z803 Family history of malignant neoplasm of breast: Secondary | ICD-10-CM | POA: Diagnosis not present

## 2020-07-05 DIAGNOSIS — D729 Disorder of white blood cells, unspecified: Secondary | ICD-10-CM

## 2020-07-05 DIAGNOSIS — R7303 Prediabetes: Secondary | ICD-10-CM | POA: Diagnosis not present

## 2020-07-05 DIAGNOSIS — R202 Paresthesia of skin: Secondary | ICD-10-CM | POA: Diagnosis not present

## 2020-07-05 DIAGNOSIS — F17210 Nicotine dependence, cigarettes, uncomplicated: Secondary | ICD-10-CM | POA: Diagnosis not present

## 2020-07-05 DIAGNOSIS — Z801 Family history of malignant neoplasm of trachea, bronchus and lung: Secondary | ICD-10-CM | POA: Diagnosis not present

## 2020-07-05 DIAGNOSIS — Z85828 Personal history of other malignant neoplasm of skin: Secondary | ICD-10-CM | POA: Insufficient documentation

## 2020-07-05 DIAGNOSIS — D72819 Decreased white blood cell count, unspecified: Secondary | ICD-10-CM | POA: Diagnosis present

## 2020-07-05 DIAGNOSIS — R200 Anesthesia of skin: Secondary | ICD-10-CM | POA: Insufficient documentation

## 2020-07-05 DIAGNOSIS — Z7984 Long term (current) use of oral hypoglycemic drugs: Secondary | ICD-10-CM | POA: Diagnosis not present

## 2020-07-05 LAB — SAVE SMEAR(SSMR), FOR PROVIDER SLIDE REVIEW

## 2020-07-05 LAB — CBC WITH DIFFERENTIAL (CANCER CENTER ONLY)
Abs Immature Granulocytes: 0.06 10*3/uL (ref 0.00–0.07)
Basophils Absolute: 0 10*3/uL (ref 0.0–0.1)
Basophils Relative: 0 %
Eosinophils Absolute: 0.1 10*3/uL (ref 0.0–0.5)
Eosinophils Relative: 1 %
HCT: 45.8 % (ref 39.0–52.0)
Hemoglobin: 15.8 g/dL (ref 13.0–17.0)
Immature Granulocytes: 0 %
Lymphocytes Relative: 25 %
Lymphs Abs: 3.3 10*3/uL (ref 0.7–4.0)
MCH: 31 pg (ref 26.0–34.0)
MCHC: 34.5 g/dL (ref 30.0–36.0)
MCV: 89.8 fL (ref 80.0–100.0)
Monocytes Absolute: 0.8 10*3/uL (ref 0.1–1.0)
Monocytes Relative: 6 %
Neutro Abs: 9.1 10*3/uL — ABNORMAL HIGH (ref 1.7–7.7)
Neutrophils Relative %: 68 %
Platelet Count: 204 10*3/uL (ref 150–400)
RBC: 5.1 MIL/uL (ref 4.22–5.81)
RDW: 13.5 % (ref 11.5–15.5)
WBC Count: 13.4 10*3/uL — ABNORMAL HIGH (ref 4.0–10.5)
nRBC: 0 % (ref 0.0–0.2)

## 2020-07-05 LAB — CMP (CANCER CENTER ONLY)
ALT: 16 U/L (ref 0–44)
AST: 15 U/L (ref 15–41)
Albumin: 4.9 g/dL (ref 3.5–5.0)
Alkaline Phosphatase: 80 U/L (ref 38–126)
Anion gap: 7 (ref 5–15)
BUN: 14 mg/dL (ref 8–23)
CO2: 28 mmol/L (ref 22–32)
Calcium: 9.8 mg/dL (ref 8.9–10.3)
Chloride: 105 mmol/L (ref 98–111)
Creatinine: 0.91 mg/dL (ref 0.61–1.24)
GFR, Est AFR Am: >60 mL/min (ref >60)
GFR, Estimated: >60 mL/min (ref >60)
Glucose, Bld: 127 mg/dL — ABNORMAL HIGH (ref 70–99)
Potassium: 4 mmol/L (ref 3.5–5.1)
Sodium: 140 mmol/L (ref 135–145)
Total Bilirubin: 0.5 mg/dL (ref 0.3–1.2)
Total Protein: 7.5 g/dL (ref 6.5–8.1)

## 2020-07-05 NOTE — Progress Notes (Signed)
Hematology/Oncology Consultation   Name: Jesse Mckee      MRN: [id number]    Location: Room/bed info not found  Date: 07/05/2020 Time:2:08 PM   REFERRING PHYSICIAN: Marda Stalker, PA-C  REASON FOR CONSULT: Disorder of white blood cells    DIAGNOSIS: Leukopenia - reactive (smoker)  HISTORY OF PRESENT ILLNESS: Mr. Jesse Mckee is a very pleasant 64 yo caucasian gentleman with at least a 5 year history of leukocytosis (was able to go back in Epic to 02/2015).  WBC count today is 13.4 and differential is unremarkable.  He is a 1 ppd smoker. He states that he and his wife are closing on a new house later this month so he is going to quit smoking.  He also states that he has sleep apnea but does not use a CPAP.  His last annual chest CT for lung cancer screening was 08/13/2019. This showed no evidence of malignancy.  He has had multiple skin cancers removed from the head, face, shoulders and arms. No history of melanoma. He goes to the dermatologist every 6 months.  His mother had history of breast cancer. Bother who was a welder had lung cancer and passed away at 65 yo.  He states that his last colonoscopy was in December 2020 and was negative.  He states that he is borderline diabetic and takes Metformin daily and Ozempic injections. He has had no issue with infections. No fever, chills, n/v, cough, rash, dizziness, SOB, chest pain, palpitations, abdominal pain or changes in bowel or bladder habits.  No episodes of bleeding. No petechiae. He does bruise easily on aspirin.  No swelling in his extremities.  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  He is originally from [place] in [place] and moved down here years ago for his job.   ROS: All other 10 point review of systems is negative.   PAST MEDICAL HISTORY:   Past Medical History:  Diagnosis Date  . Basal cell carcinoma (BCC) 10/25/2018  . Diabetes mellitus without complication  ([place])     ALLERGIES: No Known Allergies    MEDICATIONS:  Current Outpatient Medications on File Prior to Visit  Medication Sig Dispense Refill  . aspirin EC 81 MG tablet Take 1 tablet (81 mg total) by mouth daily. 90 tablet 3  . metFORMIN (GLUCOPHAGE) 500 MG tablet Take 1 tablet (500 mg total) by mouth 2 (two) times daily with a meal. 180 tablet 0  . rosuvastatin (CRESTOR) 10 MG tablet Take 1 tablet (10 mg total) by mouth daily. 90 tablet 3  . OZEMPIC, 0.25 OR 0.5 MG/DOSE, 2 MG/1.5ML SOPN Inject into the skin.     No current facility-administered medications on file prior to visit.     PAST SURGICAL HISTORY No past surgical history on file.  FAMILY HISTORY: Family History  Problem Relation Age of Onset  . Cancer Mother   . Cancer Brother     SOCIAL HISTORY:  reports that he has been smoking cigarettes. He has a 49.00 pack-year smoking history. He has never used smokeless tobacco. He reports that he does not drink alcohol and does not use drugs.  PERFORMANCE STATUS: The patient's performance status is 0 - Asymptomatic  PHYSICAL EXAM: Most Recent Vital Signs: Blood pressure 131/78, pulse 77, temperature 98.1 F (36.7 C), temperature source Oral, resp. rate 20, height 5\' 11"  (1.803 m), weight 209 lb 6.4 oz (95 kg), SpO2 97 %. BP  131/78 (BP Location: Left Arm, Patient Position: Sitting)   Pulse 77   Temp 98.1 F (36.7 C) (Oral)   Resp 20   Ht 5\' 11"  (1.803 m)   Wt 209 lb 6.4 oz (95 kg)   SpO2 97%   BMI 29.21 kg/m   General Appearance:    Alert, cooperative, no distress, appears stated age  Head:    Normocephalic, without obvious abnormality, atraumatic  Eyes:    PERRL, conjunctiva/corneas clear, EOM's intact, fundi    benign, both eyes             Throat:   Lips, mucosa, and tongue normal; teeth and gums normal  Neck:   Supple, symmetrical, trachea midline, no adenopathy;       thyroid:  No enlargement/tenderness/nodules; no carotid   bruit or JVD  Back:      Symmetric, no curvature, ROM normal, no CVA tenderness  Lungs:     Clear to auscultation bilaterally, respirations unlabored  Chest wall:    No tenderness or deformity  Heart:    Regular rate and rhythm, S1 and S2 normal, no murmur, rub   or gallop  Abdomen:     Soft, non-tender, bowel sounds active all four quadrants,    no masses, no organomegaly        Extremities:   Extremities normal, atraumatic, no cyanosis or edema  Pulses:   2+ and symmetric all extremities  Skin:   Skin color, texture, turgor normal, no rashes or lesions  Lymph nodes:   Cervical, supraclavicular, and axillary nodes normal  Neurologic:   CNII-XII intact. Normal strength, sensation and reflexes      throughout    LABORATORY DATA:  Results for orders placed or performed in visit on 07/05/20 (from the past 48 hour(s))  CBC with Differential ([place] Only)     Status: Abnormal   Collection Time: 07/05/20  1:20 PM  Result Value Ref Range   WBC Count 13.4 (H) 4.0 - 10.5 K/uL   RBC 5.10 4.22 - 5.81 MIL/uL   Hemoglobin 15.8 13.0 - 17.0 g/dL   HCT 45.8 39 - 52 %   MCV 89.8 80.0 - 100.0 fL   MCH 31.0 26.0 - 34.0 pg   MCHC 34.5 30.0 - 36.0 g/dL   RDW 13.5 11.5 - 15.5 %   Platelet Count 204 150 - 400 K/uL   nRBC 0.0 0.0 - 0.2 %   Neutrophils Relative % 68 %   Neutro Abs 9.1 (H) 1.7 - 7.7 K/uL   Lymphocytes Relative 25 %   Lymphs Abs 3.3 0.7 - 4.0 K/uL   Monocytes Relative 6 %   Monocytes Absolute 0.8 0 - 1 K/uL   Eosinophils Relative 1 %   Eosinophils Absolute 0.1 0 - 0 K/uL   Basophils Relative 0 %   Basophils Absolute 0.0 0 - 0 K/uL   Immature Granulocytes 0 %   Abs Immature Granulocytes 0.06 0.00 - 0.07 K/uL    Comment: Performed at [facility] at [facility], [address], [place], [place] [address]  Save Smear [facility])     Status: None   Collection Time: 07/05/20  1:20 PM  Result Value Ref Range   Smear Review SMEAR STAINED AND AVAILABLE FOR REVIEW     Comment:  Performed at [facility] at [facility], [address] [place], [place] [address]  CMP ([place] only)     Status:  Abnormal   Collection Time: 07/05/20  1:20 PM  Result Value Ref Range   Sodium 140 135 - 145 mmol/L   Potassium 4.0 3.5 - 5.1 mmol/L   Chloride 105 98 - 111 mmol/L   CO2 28 22 - 32 mmol/L   Glucose, Bld 127 (H) 70 - 99 mg/dL    Comment: Glucose reference range applies only to samples taken after fasting for at least 8 hours.   BUN 14 8 - 23 mg/dL   Creatinine 0.91 0.61 - 1.24 mg/dL   Calcium 9.8 8.9 - 10.3 mg/dL   Total Protein 7.5 6.5 - 8.1 g/dL   Albumin 4.9 3.5 - 5.0 g/dL   AST 15 15 - 41 U/L   ALT 16 0 - 44 U/L   Alkaline Phosphatase 80 38 - 126 U/L   Total Bilirubin 0.5 0.3 - 1.2 mg/dL   GFR, Est Non Af Am >60 >60 mL/min   GFR, Est AFR Am >60 >60 mL/min   Anion gap 7 5 - 15    Comment: Performed at [facility] at [facility], [address] [place],  [address]      RADIOGRAPHY: No results found.     PATHOLOGY: None   ASSESSMENT/PLAN: Mr. Pilar is a very pleasant 64 yo caucasian gentleman with a long history of leukocytosis felt to be reactive due to smoking.  He is asymptomatic at this time and has no complaints.  His blood smear was reviewed by Dr. Marin Olp and no abnormilty or evidence of malignancy was noted.  We will plan to see him again in 6 months and if everything is stable at that time we will let him go from our office.   All questions were answered and he is in agreement with the plan. He can contact our office with any questions or concerns. We can certainly see him sooner if needed.   He was discussed with and also seen by Dr. Marin Olp and he is in agreement with the aforementioned.    Laverna Peace, NP   ADDENDUM: I saw and examined Mr. Strausbaugh with Judson Roch.  I agree with the above assessment.  I suspect that the leukocytosis is reactive.  I would add his blood smear under the  microscope.  He had good maturation of his white blood cells.  I saw no immature myeloid or lymphoid cells.  He had no hypersegmented polys.  There is no nucleated red blood cells.  The white cell elevation is minimal.  It sounds like he has had this for a little bit.  He says last chest x-ray was a year ago.  I was think about a leukemoid reaction from malignancy.  However, he said the chest x-ray looked okay without any problem.  I will plan to see him in 6 months.  If everything looks fine at 6 months, then we can likely let him go from the clinic.  I do not see a need for an invasive study.  He does not need a bone marrow test.  He seems very healthy.  He was fun to talk to.  He spent about 45 minutes with Mr. Huseby today.  We answered his questions.  We reviewed his lab work with him.  Lattie Haw, MD

## 2020-07-06 ENCOUNTER — Telehealth: Payer: Self-pay | Admitting: Hematology & Oncology

## 2020-07-06 LAB — LACTATE DEHYDROGENASE: LDH: 155 U/L (ref 98–192)

## 2020-07-06 NOTE — Telephone Encounter (Signed)
Appointments scheduled calendar printed & mailed per 7/19 los

## 2020-07-08 NOTE — Progress Notes (Signed)
Cardiology Office Note:   Date:  07/09/2020  NAME:  Jesse Mckee    MRN: [id number] DOB:  01-27-1956   PCP:  Marda Stalker, PA-C  Cardiologist:  Evalina Field, MD   Referring MD: Marda Stalker, PA-C   Chief Complaint  Patient presents with  . Follow-up    History of Present Illness:   Jesse Mckee is a 64 y.o. male with a hx of CAD, DM, PAD, tobacco abuse who presents for follow-up. On crestor and recent LDL at goal.  He reports he is doing well.  He is still smoking 1/2 pack/day.  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  He has no limitations such as chest pain, shortness of breath or palpitations.  Blood pressures well controlled today.  140/83.  He is on no blood pressure medication.  He seems to be improving his diabetes as his A1c is now 6.4.  We did discuss that he will need a repeat CT of his chest.  He will do this in a few months before he sees me back in 6 months.  All other conditions are quite stable.  Problem List 1. Diabetes  -A1c 6.4 2. CAD -calcium seen on chest C 3. Hyperlipidemia -T chol 97, HDL 32, LDL 42, TG 129 4. Ascending aortic dilation (4.3 cm CTA 10/2019) 5. Cirrhosis (Seen on chest CT) 6. PAD -50% SMA -50% narrowing R external iliac 7. Tobacco Abuse   Past Medical History: Past Medical History:  Diagnosis Date  . Basal cell carcinoma (BCC) 10/25/2018  . Diabetes mellitus without complication [facility])     Past Surgical History: History reviewed. No pertinent surgical history.  Current Medications: Current Meds  Medication Sig  . aspirin EC 81 MG tablet Take 1 tablet (81 mg total) by mouth daily.  . metFORMIN (GLUCOPHAGE) 500 MG tablet Take 1 tablet (500 mg total) by mouth 2 (two) times daily with a meal.  . OZEMPIC, 0.25 OR 0.5 MG/DOSE, 2 MG/1.5ML SOPN Inject into the skin.     Allergies:    Patient has no known allergies.   Social History: Social History   Socioeconomic History  . Marital status: Married    Spouse  name: Not on file  . Number of children: Not on file  . Years of education: Not on file  . Highest education level: Not on file  Occupational History  . Not on file  Tobacco Use  . Smoking status: Current Every Day Smoker    Packs/day: 1.00    Years: 49.00    Pack years: 49.00    Types: Cigarettes  . Smokeless tobacco: Never Used  Vaping Use  . Vaping Use: Never used  Substance and Sexual Activity  . Alcohol use: No    Alcohol/week: 0.0 standard drinks  . Drug use: No  . Sexual activity: Not on file  Other Topics Concern  . Not on file  Social History Narrative  . Not on file   Social Determinants of Health   Financial Resource Strain:   . Difficulty of Paying Living Expenses:   Food Insecurity:   . Worried About Charity fundraiser in the Last Year:   . Arboriculturist in the Last Year:   Transportation Needs:   . Film/video editor (Medical):   Marland Kitchen Lack of Transportation (Non-Medical):   Physical Activity:   . Days of Exercise per Week:   . Minutes of Exercise per Session:   Stress:   .  Feeling of Stress :   Social Connections:   . Frequency of Communication with Friends and Family:   . Frequency of Social Gatherings with Friends and Family:   . Attends Religious Services:   . Active Member of Clubs or Organizations:   . Attends Archivist Meetings:   Marland Kitchen Marital Status:      Family History: The patient's family history includes Cancer in his brother and mother.  ROS:   All other ROS reviewed and negative. Pertinent positives noted in the HPI.     EKGs/Labs/Other Studies Reviewed:   The following studies were personally reviewed by me today:  TTE 10/16/2019 1. Left ventricular ejection fraction, by visual estimation, is 60 to  65%. The left ventricle has normal function. There is mildly increased  left ventricular hypertrophy.  2. Left ventricular diastolic parameters are consistent with Grade I  diastolic dysfunction (impaired relaxation).   3. Global right ventricle has normal systolic function.The right  ventricular size is normal. No increase in right ventricular wall  thickness.  4. Left atrial size was normal.  5. Right atrial size was normal.  6. The mitral valve is normal in structure. Trace mitral valve  regurgitation.  7. The tricuspid valve is normal in structure. Tricuspid valve  regurgitation is trivial.  8. The aortic valve is normal in structure. Aortic valve regurgitation is  not visualized. No evidence of aortic valve sclerosis or stenosis.  9. The pulmonic valve was normal in structure. Pulmonic valve  regurgitation is not visualized.  10. Aortic dilatation noted.  11. There is mild to moderate dilatation of the ascending aorta measuring  43 mm.  12. The atrial septum is grossly normal.  13. The average left ventricular global longitudinal strain is -19.4 %.   CTA A/P 12/23/2019 1. No evidence of renal artery stenosis. Additionally, there is no evidence acute or chronic renal infarction. 2. Large amount of atherosclerotic plaque within a normal caliber abdominal aorta. Aortic Atherosclerosis (ICD10-I70.0). 3. Suspected 50% luminal narrowing involving the mid/distal aspect of the main trunk of the SMA, however the distal tributaries of the SMA appear widely patent without discrete intraluminal filling defect to suggest distal embolism. Additionally, the celiac and IMA are both patent without hemodynamically significant stenosis. 4. Suspected tandem areas of 50% luminal narrowing involving the proximal aspect of the right external iliac artery. Correlation for right lower extremity PAD symptoms is advised.  Recent Labs: 07/05/2020: ALT 16; BUN 14; Creatinine 0.91; Hemoglobin 15.8; Platelet Count 204; Potassium 4.0; Sodium 140   Recent Lipid Panel    Component Value Date/Time   CHOL 97 (L) 01/09/2020 0830   TRIG 129 01/09/2020 0830   HDL 32 (L) 01/09/2020 0830   CHOLHDL 3.0 01/09/2020 0830    LDLCALC 42 01/09/2020 0830    Physical Exam:   VS:  BP (!) 140/83   Pulse 71   Temp 98.2 F (36.8 C)   Ht 5\' 11"  (1.803 m)   Wt (!) 210 lb (95.3 kg)   SpO2 96%   BMI 29.29 kg/m    Wt Readings from Last 3 Encounters:  07/09/20 (!) 210 lb (95.3 kg)  07/05/20 209 lb 6.4 oz (95 kg)  01/09/20 215 lb 6.4 oz (97.7 kg)    General: Well nourished, well developed, in no acute distress Heart: Atraumatic, normal size  Eyes: PEERLA, EOMI  Neck: Supple, no JVD Endocrine: No thryomegaly Cardiac: Normal S1, S2; RRR; no murmurs, rubs, or gallops Lungs: Clear to auscultation bilaterally, no  wheezing, rhonchi or rales  Abd: Soft, nontender, no hepatomegaly  Ext: Absent pulses in the lower extremities Musculoskeletal: No deformities, BUE and BLE strength normal and equal Skin: Warm and dry, no rashes   Neuro: Alert and oriented to person, place, time, and situation, CNII-XII grossly intact, no focal deficits  Psych: Normal mood and affect   ASSESSMENT:   Jesse Mckee is a 64 y.o. male who presents for the following: 1. Mixed hyperlipidemia   2. Coronary artery disease involving native coronary artery of native heart without angina pectoris   3. PAD (peripheral artery disease) ([place])   4. Aortic ectasia (HCC)   5. Tobacco abuse   6. Thoracic aortic aneurysm without rupture ([place])     PLAN:   1. Mixed hyperlipidemia 2. Coronary artery disease involving native coronary artery of native heart without angina pectoris 3. PAD (peripheral artery disease) (HCC) -Coronary calcification seen on CT scan.  He also was found to have pretty significant PAD on his CT abdomen pelvis.  He has no symptoms of angina.  He has no symptoms of PAD.  His CT scan showed 50% blockages in the right iliac arteries. -Blood pressure acceptable.  Most recent LDL cholesterol is at goal.  Continue statin and aspirin.  It is less than 70.  His diabetes number is 6.4.  He will continue to work on this. -Biggest issue is  smoking.  Still smoking 1/2 pack/day.  Smoking cessation advised today.  4. Aortic ectasia (HCC) -CTA demonstrates ascending aorta is 4.3 cm.  This does match his echo.  We will plan to repeat an echo in November of this year or roughly before I see him back in 6 months.  As long as there is no change we will continue to not monitor this anymore.  5. Tobacco abuse -Still smoking 1/2 pack/day.  Has coronary calcifications.  Has occlusions noted in the right external iliac arteries.  He does need to quit smoking.  Smoking cessation advised today.   Disposition: Return in about 6 months (around 01/09/2021).  Medication Adjustments/Labs and Tests Ordered: Current medicines are reviewed at length with the patient today.  Concerns regarding medicines are outlined above.  Orders Placed This Encounter  Procedures  . ECHOCARDIOGRAM COMPLETE   No orders of the defined types were placed in this encounter.   Patient Instructions  Medication Instructions:  The current medical regimen is effective;  continue present plan and medications as directed. Please refer to the Current Medication list given to you today. *If you need a refill on your cardiac medications before your next appointment, please call your pharmacy*  Testing/Procedures: Echocardiogram - Your physician has requested that you have an echocardiogram. Echocardiography is a painless test that uses sound waves to create images of your heart. It provides your doctor with information about the size and shape of your heart and how well your heart's chambers and valves are working. This procedure takes approximately one hour. There are no restrictions for this procedure. This will be performed at our [facility] location - [address].  Follow-Up: Your next appointment:  AFTER ECHO   In Person with Eleonore Chiquito, MD  At [facility], you and your health needs are our priority.  As part of our continuing mission to provide you  with exceptional heart care, we have created designated Provider Care Teams.  These Care Teams include your primary Cardiologist (physician) and Advanced Practice Providers (APPs -  Physician Assistants and Nurse Practitioners)  who all work together to provide you with the care you need, when you need it.  We recommend signing up for the patient portal called "MyChart".  Sign up information is provided on this After Visit Summary.  MyChart is used to connect with patients for Virtual Visits (Telemedicine).  Patients are able to view lab/test results, encounter notes, upcoming appointments, etc.  Non-urgent messages can be sent to your provider as well.   To learn more about what you can do with MyChart, go to [email or web address].       Time Spent with Patient: I have spent a total of 30 minutes with patient reviewing hospital notes, telemetry, EKGs, labs and examining the patient as well as establishing an assessment and plan that was discussed with the patient.  > 50% of time was spent in direct patient care.  Signed, Addison Naegeli. Audie Box, [place]  [address], [place], [place] [address] [phone]  07/09/2020 8:28 AM

## 2020-07-09 ENCOUNTER — Encounter: Payer: Self-pay | Admitting: Cardiovascular Disease

## 2020-07-09 ENCOUNTER — Ambulatory Visit: Payer: 59 | Admitting: Cardiovascular Disease

## 2020-07-09 ENCOUNTER — Other Ambulatory Visit: Payer: Self-pay

## 2020-07-09 VITALS — BP 140/83 | HR 71 | Temp 98.2°F | Ht 71.0 in | Wt 210.0 lb

## 2020-07-09 DIAGNOSIS — I77819 Aortic ectasia, unspecified site: Secondary | ICD-10-CM

## 2020-07-09 DIAGNOSIS — E782 Mixed hyperlipidemia: Secondary | ICD-10-CM

## 2020-07-09 DIAGNOSIS — I2510 Atherosclerotic heart disease of native coronary artery without angina pectoris: Secondary | ICD-10-CM

## 2020-07-09 DIAGNOSIS — I739 Peripheral vascular disease, unspecified: Secondary | ICD-10-CM

## 2020-07-09 DIAGNOSIS — I712 Thoracic aortic aneurysm, without rupture, unspecified: Secondary | ICD-10-CM

## 2020-07-09 DIAGNOSIS — Z720 Tobacco use: Secondary | ICD-10-CM

## 2020-07-09 NOTE — Patient Instructions (Signed)
Medication Instructions:  The current medical regimen is effective;  continue present plan and medications as directed. Please refer to the Current Medication list given to you today. *If you need a refill on your cardiac medications before your next appointment, please call your pharmacy*  Testing/Procedures: Echocardiogram - Your physician has requested that you have an echocardiogram. Echocardiography is a painless test that uses sound waves to create images of your heart. It provides your doctor with information about the size and shape of your heart and how well your heart's chambers and valves are working. This procedure takes approximately one hour. There are no restrictions for this procedure. This will be performed at our [facility] location - [address], [address].  Follow-Up: Your next appointment:  AFTER ECHO   In Person with Eleonore Chiquito, MD  At [facility], you and your health needs are our priority.  As part of our continuing mission to provide you with exceptional heart care, we have created designated Provider Care Teams.  These Care Teams include your primary Cardiologist (physician) and Advanced Practice Providers (APPs -  Physician Assistants and Nurse Practitioners) who all work together to provide you with the care you need, when you need it.  We recommend signing up for the patient portal called "MyChart".  Sign up information is provided on this After Visit Summary.  MyChart is used to connect with patients for Virtual Visits (Telemedicine).  Patients are able to view lab/test results, encounter notes, upcoming appointments, etc.  Non-urgent messages can be sent to your provider as well.   To learn more about what you can do with MyChart, go to [email or web address].

## 2020-07-29 ENCOUNTER — Telehealth: Payer: Self-pay | Admitting: Acute Care

## 2020-07-29 DIAGNOSIS — F17210 Nicotine dependence, cigarettes, uncomplicated: Secondary | ICD-10-CM

## 2020-07-29 NOTE — Telephone Encounter (Signed)
Rodena Piety, have you been able to contact pt to schedule his f/u low dose ct?

## 2020-07-29 NOTE — Telephone Encounter (Signed)
Jesse Mckee the patient had a CT Angio Chest Aorta done on 11/06/2019 see below so when should I schedule the LCS CT due 07/2020  3. No edema or consolidation. Scattered 1-3 mm nodular opacities noted within the lungs. No follow-up needed if patient is low-risk (and has no known or suspected primary neoplasm). Non-contrast chest CT can be considered in 12 months if patient is high-risk. This recommendation

## 2020-08-18 NOTE — Telephone Encounter (Signed)
Denise please advise on message

## 2020-08-20 NOTE — Telephone Encounter (Signed)
Will need Repeat low dose screening chest ct in 10/2020. Let me know If you need a new order.

## 2020-08-25 NOTE — Telephone Encounter (Signed)
Yes Langley Gauss would you please put in at new Ct order for 10/2020

## 2020-08-27 NOTE — Telephone Encounter (Signed)
New CT order placed. Nothing further needed. Jesse Mckee will schedule pt for 10/2020.

## 2020-10-22 ENCOUNTER — Ambulatory Visit (HOSPITAL_COMMUNITY): Payer: 59 | Attending: Cardiology

## 2020-10-22 ENCOUNTER — Other Ambulatory Visit: Payer: Self-pay

## 2020-10-22 DIAGNOSIS — I712 Thoracic aortic aneurysm, without rupture, unspecified: Secondary | ICD-10-CM

## 2020-10-22 LAB — ECHOCARDIOGRAM COMPLETE
Area-P 1/2: 3.21 cm2
S' Lateral: 2.5 cm

## 2020-10-27 ENCOUNTER — Other Ambulatory Visit: Payer: Self-pay | Admitting: *Deleted

## 2020-10-27 DIAGNOSIS — I712 Thoracic aortic aneurysm, without rupture, unspecified: Secondary | ICD-10-CM

## 2021-01-05 ENCOUNTER — Encounter: Payer: Self-pay | Admitting: Hematology & Oncology

## 2021-01-05 ENCOUNTER — Inpatient Hospital Stay (HOSPITAL_BASED_OUTPATIENT_CLINIC_OR_DEPARTMENT_OTHER): Payer: 59 | Admitting: Hematology & Oncology

## 2021-01-05 ENCOUNTER — Other Ambulatory Visit: Payer: Self-pay

## 2021-01-05 ENCOUNTER — Inpatient Hospital Stay: Payer: 59 | Attending: Hematology & Oncology

## 2021-01-05 VITALS — BP 142/74 | HR 75 | Temp 97.7°F | Resp 20 | Wt 201.0 lb

## 2021-01-05 DIAGNOSIS — Z7984 Long term (current) use of oral hypoglycemic drugs: Secondary | ICD-10-CM | POA: Insufficient documentation

## 2021-01-05 DIAGNOSIS — D72823 Leukemoid reaction: Secondary | ICD-10-CM

## 2021-01-05 DIAGNOSIS — D72829 Elevated white blood cell count, unspecified: Secondary | ICD-10-CM | POA: Diagnosis present

## 2021-01-05 DIAGNOSIS — F17210 Nicotine dependence, cigarettes, uncomplicated: Secondary | ICD-10-CM | POA: Diagnosis not present

## 2021-01-05 DIAGNOSIS — D729 Disorder of white blood cells, unspecified: Secondary | ICD-10-CM

## 2021-01-05 DIAGNOSIS — Z79899 Other long term (current) drug therapy: Secondary | ICD-10-CM | POA: Insufficient documentation

## 2021-01-05 DIAGNOSIS — Z7982 Long term (current) use of aspirin: Secondary | ICD-10-CM | POA: Insufficient documentation

## 2021-01-05 LAB — CBC WITH DIFFERENTIAL (CANCER CENTER ONLY)
Abs Immature Granulocytes: 0.03 10*3/uL (ref 0.00–0.07)
Basophils Absolute: 0 10*3/uL (ref 0.0–0.1)
Basophils Relative: 0 %
Eosinophils Absolute: 0.2 10*3/uL (ref 0.0–0.5)
Eosinophils Relative: 2 %
HCT: 46.7 % (ref 39.0–52.0)
Hemoglobin: 15.8 g/dL (ref 13.0–17.0)
Immature Granulocytes: 0 %
Lymphocytes Relative: 25 %
Lymphs Abs: 2.7 10*3/uL (ref 0.7–4.0)
MCH: 30.7 pg (ref 26.0–34.0)
MCHC: 33.8 g/dL (ref 30.0–36.0)
MCV: 90.9 fL (ref 80.0–100.0)
Monocytes Absolute: 0.8 10*3/uL (ref 0.1–1.0)
Monocytes Relative: 8 %
Neutro Abs: 7.2 10*3/uL (ref 1.7–7.7)
Neutrophils Relative %: 65 %
Platelet Count: 209 10*3/uL (ref 150–400)
RBC: 5.14 MIL/uL (ref 4.22–5.81)
RDW: 13.3 % (ref 11.5–15.5)
WBC Count: 10.9 10*3/uL — ABNORMAL HIGH (ref 4.0–10.5)
nRBC: 0 % (ref 0.0–0.2)

## 2021-01-05 LAB — CMP (CANCER CENTER ONLY)
ALT: 14 U/L (ref 0–44)
AST: 14 U/L — ABNORMAL LOW (ref 15–41)
Albumin: 4.3 g/dL (ref 3.5–5.0)
Alkaline Phosphatase: 76 U/L (ref 38–126)
Anion gap: 6 (ref 5–15)
BUN: 16 mg/dL (ref 8–23)
CO2: 28 mmol/L (ref 22–32)
Calcium: 9.6 mg/dL (ref 8.9–10.3)
Chloride: 107 mmol/L (ref 98–111)
Creatinine: 0.95 mg/dL (ref 0.61–1.24)
GFR, Estimated: >60 mL/min (ref >60)
Glucose, Bld: 107 mg/dL — ABNORMAL HIGH (ref 70–99)
Potassium: 4.2 mmol/L (ref 3.5–5.1)
Sodium: 141 mmol/L (ref 135–145)
Total Bilirubin: 0.5 mg/dL (ref 0.3–1.2)
Total Protein: 6.9 g/dL (ref 6.5–8.1)

## 2021-01-05 LAB — LACTATE DEHYDROGENASE: LDH: 117 U/L (ref 98–192)

## 2021-01-05 LAB — SAVE SMEAR(SSMR), FOR PROVIDER SLIDE REVIEW

## 2021-01-05 NOTE — Progress Notes (Signed)
Hematology and Oncology Follow Up Visit  Jesse Mckee [id number] 09/30/1956 65 y.o. 01/05/2021   Principle Diagnosis:   Leukocytosis-transient secondary to tobacco use  Current Therapy:    Observation     Interim History:  Jesse Mckee is back for follow-up.  This is his second office visit.  We first saw him back in July.  At that time, his white cell count was on the higher side.  He had a blood smear that looked reactive.  I do not see any immature myeloid or lymphoid cells.  He is still smoking.  He smokes less than a half a pack a day now.  He works at a warehouse loading trucks.  He has no obvious occupational exposures.  He has had no problems with infection.  He has had no cough or shortness of breath.  Has had no nausea or vomiting.  There is no change in bowel or bladder habits.  There has been no problems with rashes.  Overall, his performance status is ECOG 0.  Medications:  Current Outpatient Medications:  .  aspirin EC 81 MG tablet, Take 1 tablet (81 mg total) by mouth daily., Disp: 90 tablet, Rfl: 3 .  metFORMIN (GLUCOPHAGE) 500 MG tablet, Take 1 tablet (500 mg total) by mouth 2 (two) times daily with a meal., Disp: 180 tablet, Rfl: 0 .  OZEMPIC, 0.25 OR 0.5 MG/DOSE, 2 MG/1.5ML SOPN, Inject into the skin once a week., Disp: , Rfl:  .  rosuvastatin (CRESTOR) 10 MG tablet, Take 1 tablet (10 mg total) by mouth daily., Disp: 90 tablet, Rfl: 3  Allergies: No Known Allergies  Past Medical History, Surgical history, Social history, and Family History were reviewed and updated.  Review of Systems: Review of Systems  Constitutional: Negative.   HENT:  Negative.  Negative for hearing loss.   Eyes: Negative.   Respiratory: Negative.   Cardiovascular: Negative.   Gastrointestinal: Negative.   Endocrine: Negative.   Genitourinary: Negative.    Musculoskeletal: Negative.   Skin: Negative.   Neurological: Negative.   Hematological: Negative.   Psychiatric/Behavioral:  Negative.     Physical Exam:  weight is 201 lb (91.2 kg). His oral temperature is 97.7 F (36.5 C). His blood pressure is 142/74 (abnormal) and his pulse is 75. His respiration is 20 and oxygen saturation is 98%.   Wt Readings from Last 3 Encounters:  01/05/21 201 lb (91.2 kg)  07/09/20 (!) 210 lb (95.3 kg)  07/05/20 209 lb 6.4 oz (95 kg)    Physical Exam Vitals reviewed.  HENT:     Head: Normocephalic and atraumatic.     Mouth/Throat:     Mouth: Oropharynx is clear and moist.  Eyes:     Extraocular Movements: EOM normal.     Pupils: Pupils are equal, round, and reactive to light.  Cardiovascular:     Rate and Rhythm: Normal rate and regular rhythm.     Heart sounds: Normal heart sounds.  Pulmonary:     Effort: Pulmonary effort is normal.     Breath sounds: Normal breath sounds.  Abdominal:     General: Bowel sounds are normal.     Palpations: Abdomen is soft.  Musculoskeletal:        General: No tenderness, deformity or edema. Normal range of motion.     Cervical back: Normal range of motion.  Lymphadenopathy:     Cervical: No cervical adenopathy.  Skin:    General: Skin is warm and dry.  Findings: No erythema or rash.  Neurological:     Mental Status: He is alert and oriented to person, place, and time.  Psychiatric:        Mood and Affect: Mood and affect normal.        Behavior: Behavior normal.        Thought Content: Thought content normal.        Judgment: Judgment normal.      Lab Results  Component Value Date   WBC 10.9 (H) 01/05/2021   HGB 15.8 01/05/2021   HCT 46.7 01/05/2021   MCV 90.9 01/05/2021   PLT 209 01/05/2021     Chemistry      Component Value Date/Time   NA 141 01/05/2021 1259   NA 142 10/30/2019 0857   K 4.2 01/05/2021 1259   CL 107 01/05/2021 1259   CO2 28 01/05/2021 1259   BUN 16 01/05/2021 1259   BUN 13 10/30/2019 0857   CREATININE 0.95 01/05/2021 1259   CREATININE 0.74 05/28/2015 1154      Component Value Date/Time    CALCIUM 9.6 01/05/2021 1259   ALKPHOS 76 01/05/2021 1259   AST 14 (L) 01/05/2021 1259   ALT 14 01/05/2021 1259   BILITOT 0.5 01/05/2021 1259      Impression and Plan: Jesse Mckee is a very nice 65 year old white male.  He has minimal leukocytosis today.  Again, I have to believe that this is all reactive.  I do not see anything that looks like an underlying myeloproliferative disorder.  I looked at his blood smear under the microscope.  He has good maturity of his white blood cells.  There is no nucleated red blood cells.  Platelets are well granulated.  At this point in time, I just do not think we have to get him back.  I do still see that we are going to make a change in his medical care.  I do not see that he has a underlying bone marrow issue.  He does not need to have any invasive procedures done.  I think the big issue is his tobacco use.  He is trying to cut back.  We will be more than happy to see him back in the future if there are any problems.  Hopefully, everything will go well for him.  He is very interesting to talk to.   Volanda Napoleon, MD 1/19/20222:24 PM

## 2021-01-06 ENCOUNTER — Telehealth: Payer: Self-pay

## 2021-01-06 NOTE — Telephone Encounter (Signed)
No 01-05-21 los entered    [place]

## 2021-05-30 IMAGING — US US ABDOMEN COMPLETE
1 series · 14 of 25 positions shown · non-contrast
Comparison: CT abdomen pelvis 05/29/2015.

CLINICAL DATA: Generalized abdominal pain for 1 week. Sickle cell
anemia.

EXAM:
ABDOMEN ULTRASOUND COMPLETE

[Series 1: us abdomen complete · 14 of 67 slices shown]
[im 1/67]
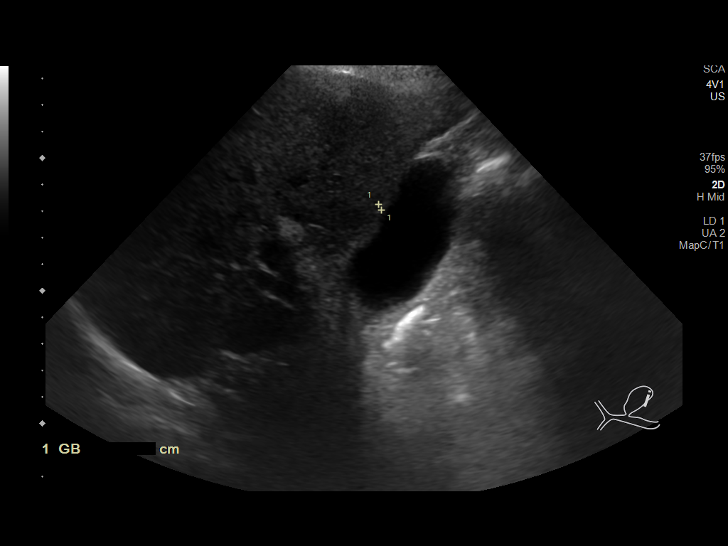
[im 6/67]
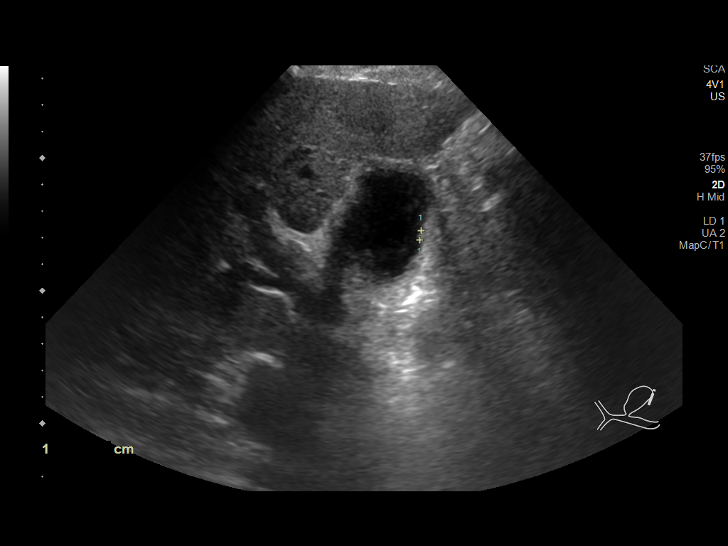
[im 12/67]
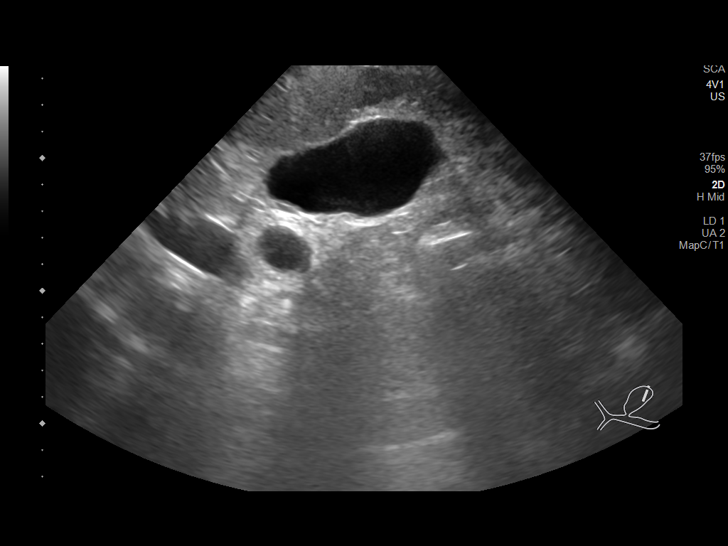
[im 17/67]
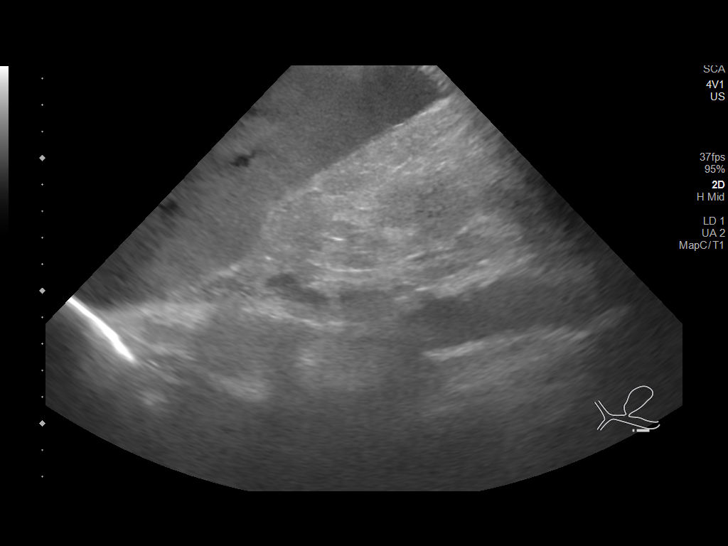
[im 23/67]
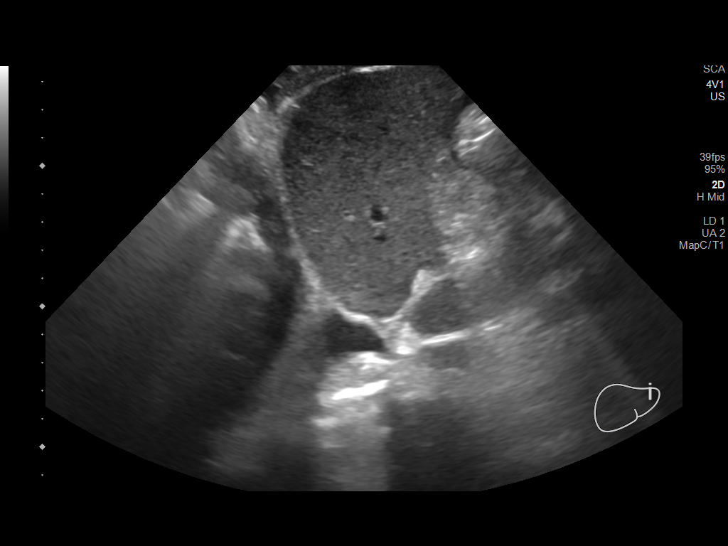
[im 25/67]
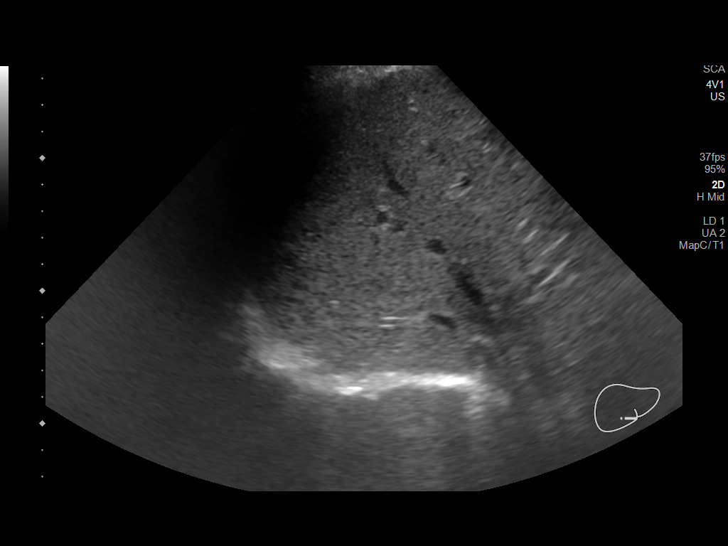
[im 31/67]
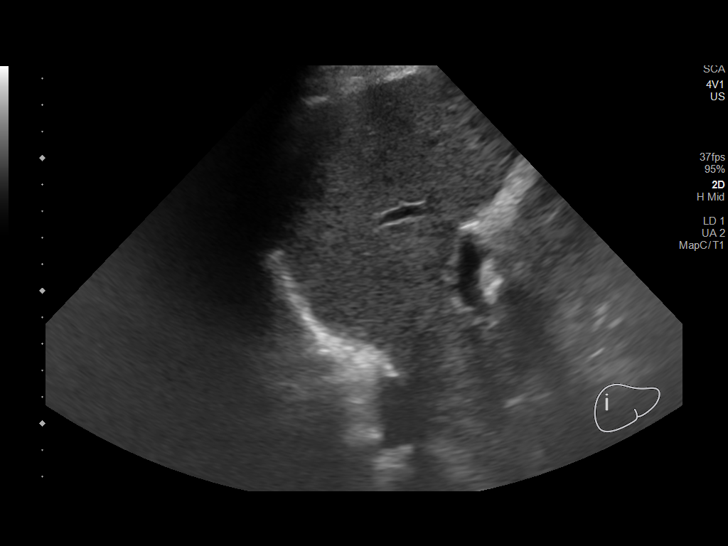
[im 36/67]
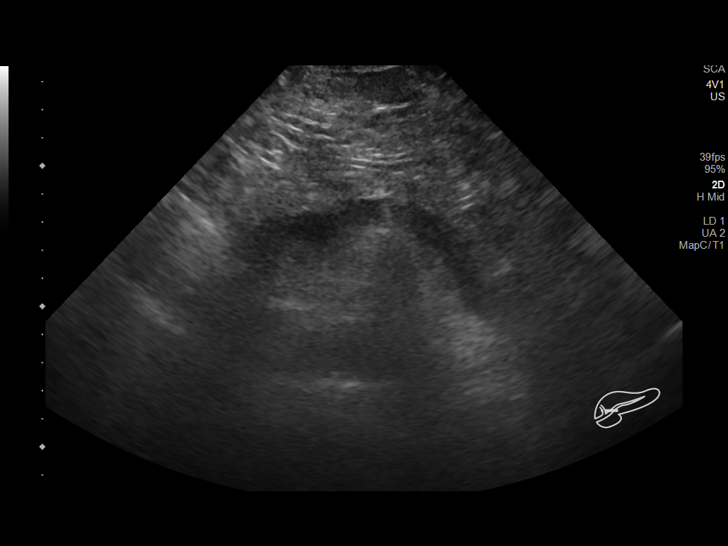
[im 42/67]
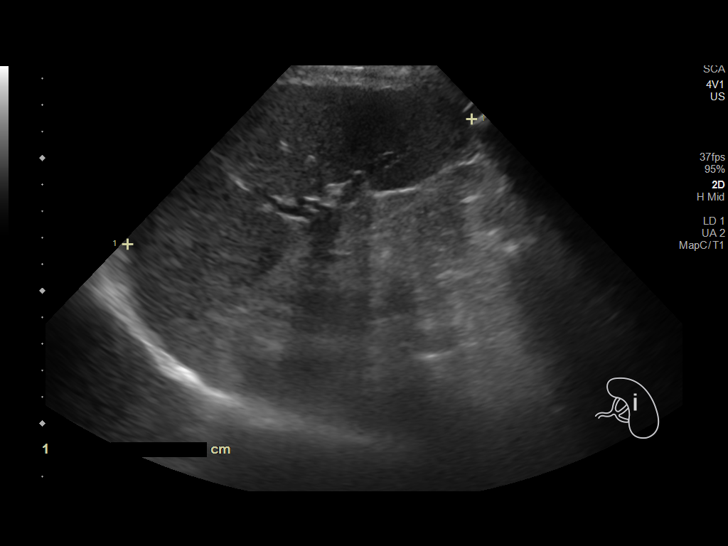
[im 45/67]
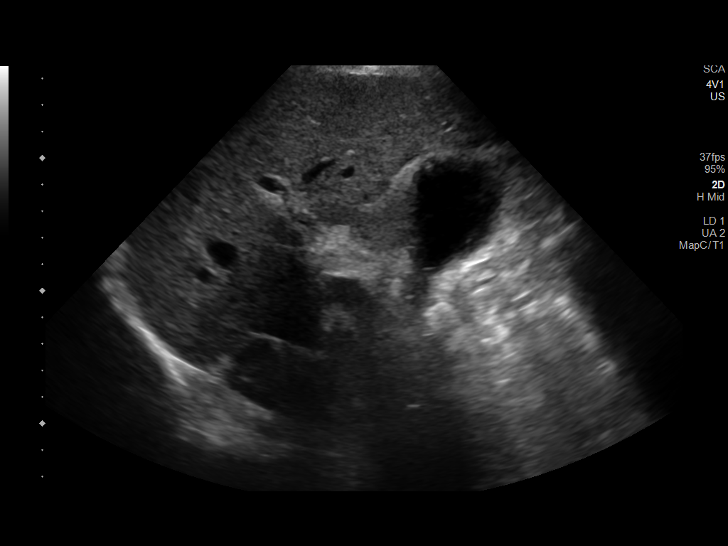
[im 50/67]
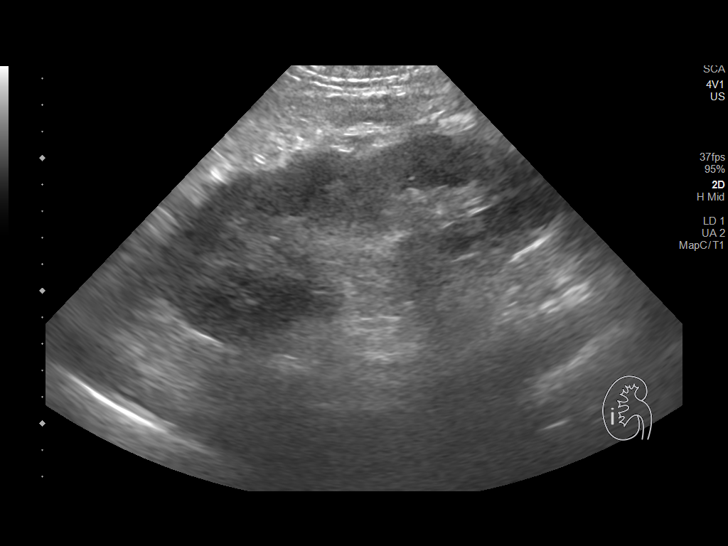
[im 56/67]
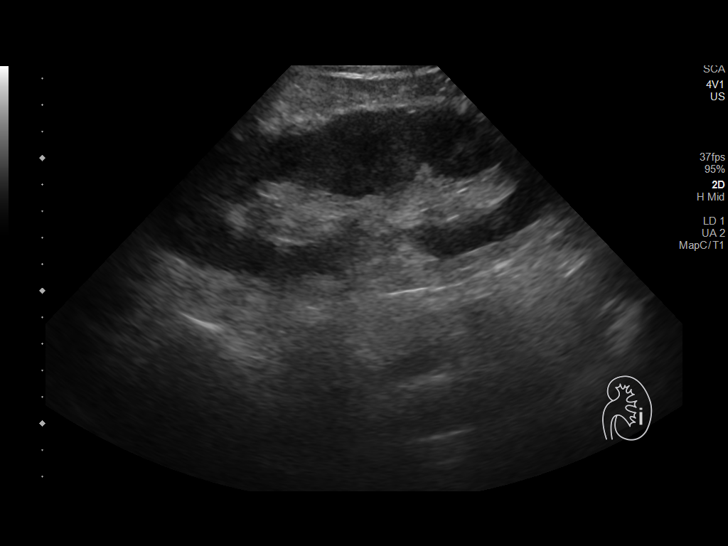
[im 61/67]
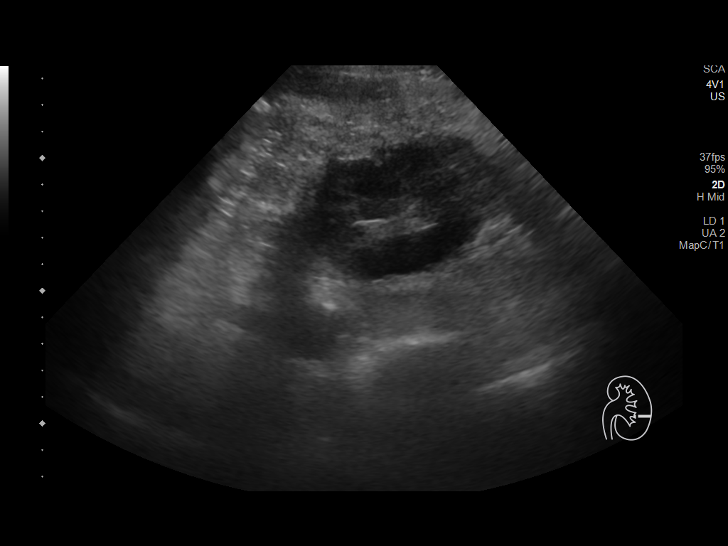
[im 67/67]
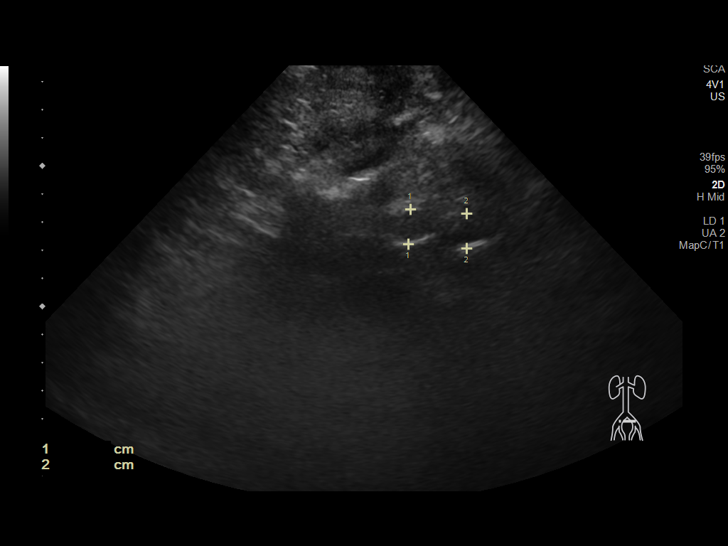

[14 of 25 positions shown; findings below may reference images not displayed]

FINDINGS: Gallbladder: No gallstones or wall thickening visualized. No
sonographic Murphy sign noted by sonographer. Small polyps measure 4
mm or less in size.

Common bile duct: Diameter: 4 mm, within normal limits.

Liver: No focal lesion identified. Within normal limits in
parenchymal echogenicity. Portal vein is patent on color Doppler
imaging with normal direction of blood flow towards the liver.

IVC: No abnormality visualized.

Pancreas: Visualized portion unremarkable.

Spleen: 13.8 x 10.5 x 6.4 cm (volume 464 cc).

Right Kidney: Length: 15.5 cm. Echogenicity within normal limits. No
mass or hydronephrosis visualized.

Left Kidney: Length: 15.7 cm. Echogenicity within normal limits. No
mass or hydronephrosis visualized.

Abdominal aorta: No aneurysm visualized.

Other findings: None.
IMPRESSION: 1. No acute findings.
2. Mild splenomegaly.

## 2021-06-19 IMAGING — CT CT CTA ABD/PEL W/CM AND/OR W/O CM
2 of 6 series · 11 of 46 positions shown, 12 images · IV contrast (Omnipaque)
Comparison: CT abdomen pelvis-12/18/2019; 05/29/2015

CLINICAL DATA: Abdominal pain and nausea since last week. Recent
diagnosis of pyelonephritis. Evaluate for renal ischemia and/or
infarction.

EXAM:
CTA ABDOMEN AND PELVIS WITHOUT AND WITH CONTRAST
TECHNIQUE: Multidetector CT imaging of the abdomen and pelvis was performed
using the standard protocol during bolus administration of
intravenous contrast. Multiplanar reconstructed images and MIPs were
obtained and reviewed to evaluate the vascular anatomy.
CONTRAST:  100mL OMNIPAQUE IOHEXOL 350 MG/ML SOLN

[Series 4: axial arterial · axial · arterial · 0.97mm/px · z∈[-673,-142]mm · 8 of 207 slices shown, 9 images]
[im 15/207  soft-tissue]
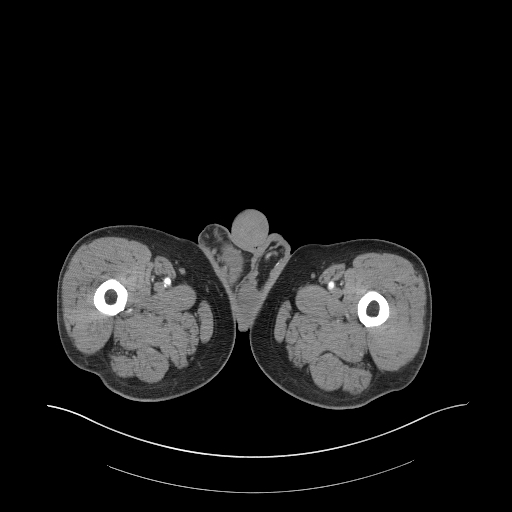
[im 15/207  bone]
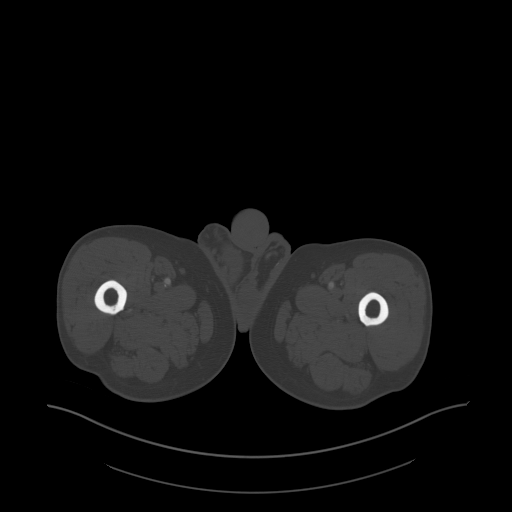
[im 45/207  soft-tissue]
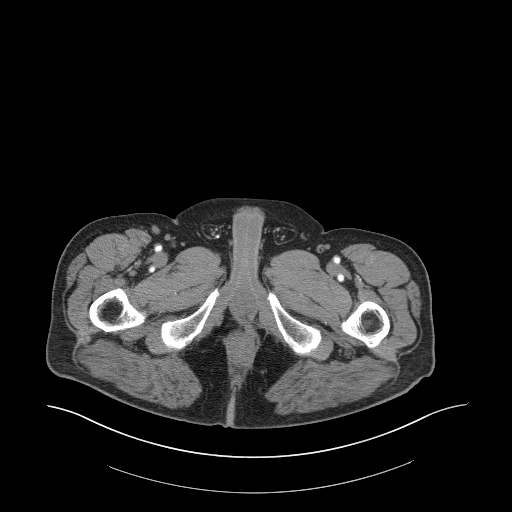
[im 67/207  soft-tissue]
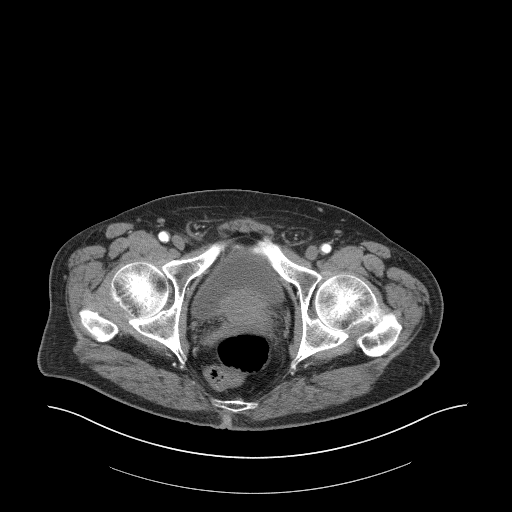
[im 89/207  soft-tissue]
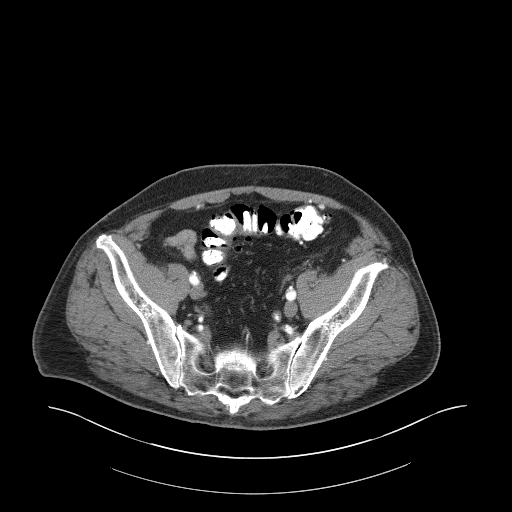
[im 118/207  soft-tissue]
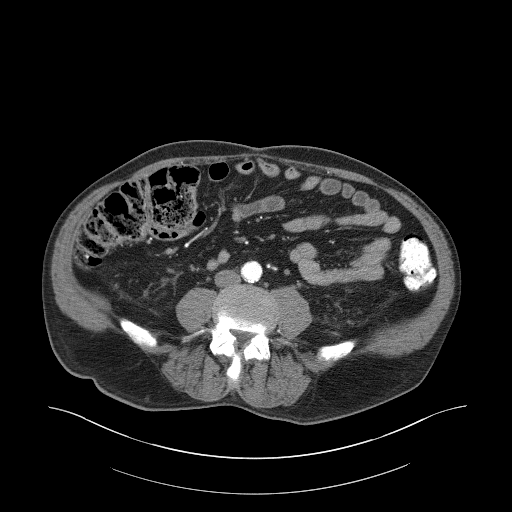
[im 140/207  soft-tissue]
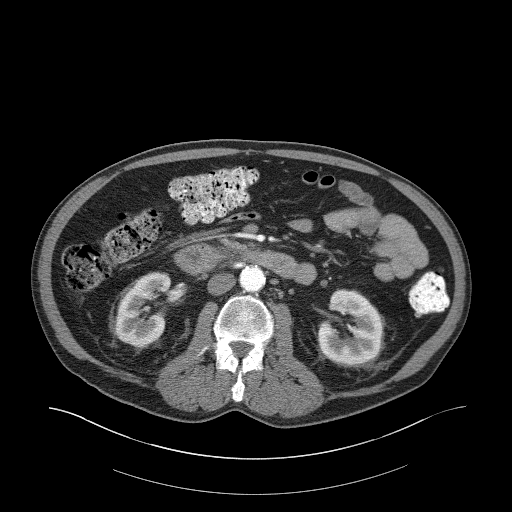
[im 162/207  soft-tissue]
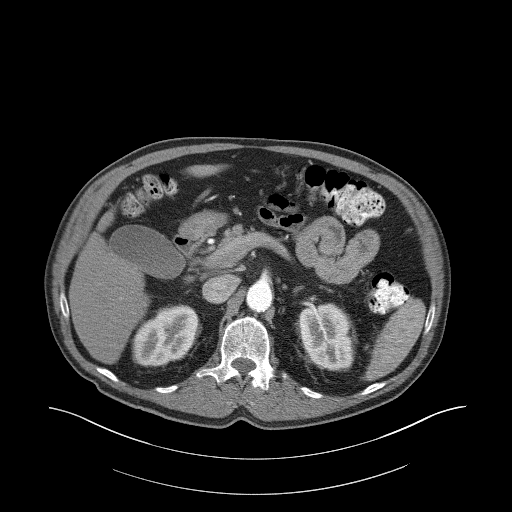
[im 192/207  soft-tissue]
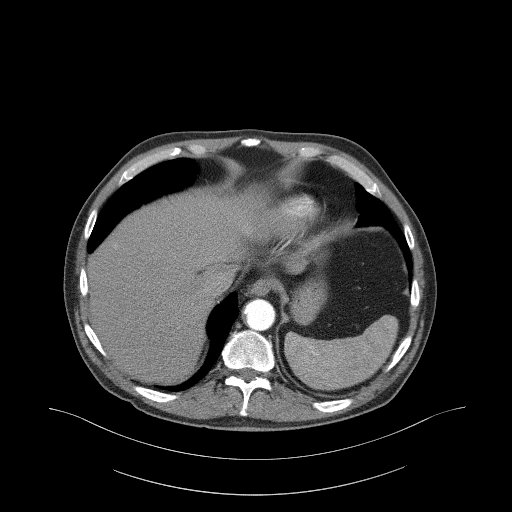

[Series 6: coronal · coronal · 0.94mm/px · 3 of 103 slices shown]
[im 26/103  soft-tissue]
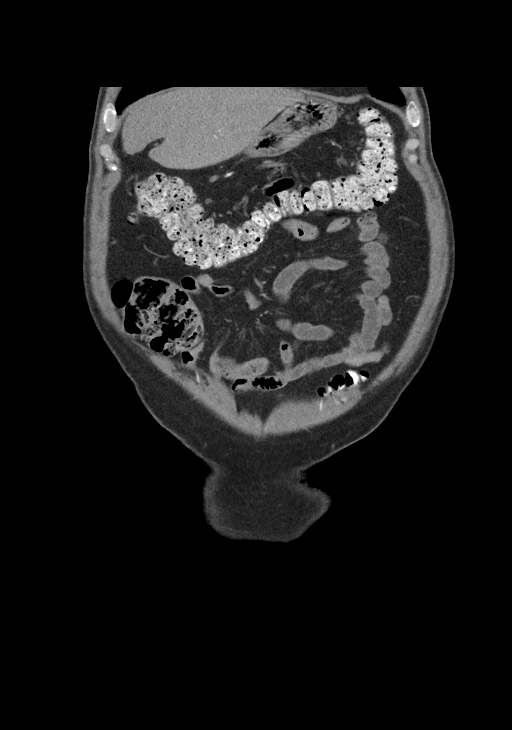
[im 52/103  soft-tissue]
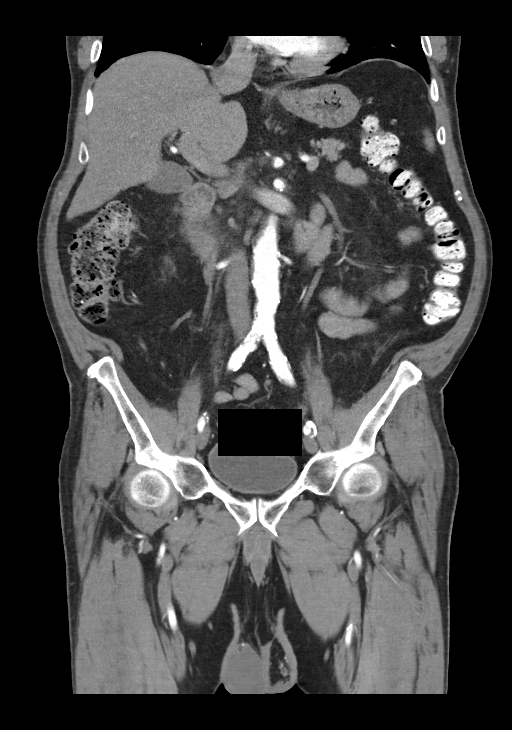
[im 77/103  soft-tissue]
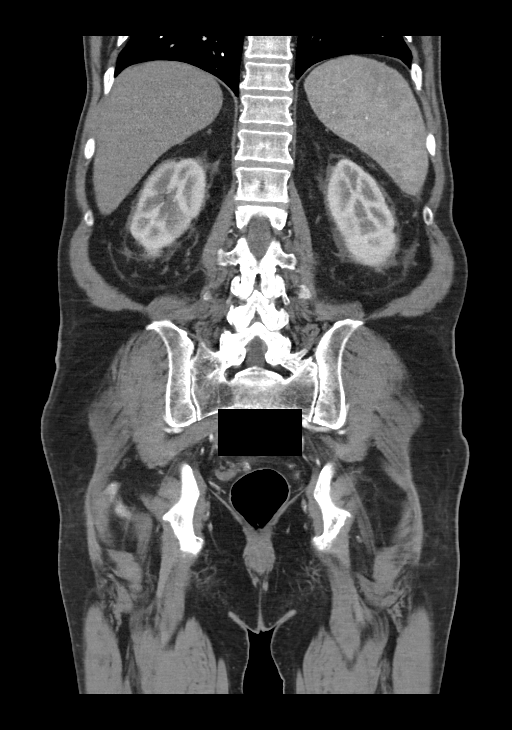

[11 of 46 positions shown; findings below may reference images not displayed]

FINDINGS: VASCULAR

Aorta: There is a moderate amount of slightly irregular mixed
calcified and noncalcified atherosclerotic plaque throughout the
normal caliber abdominal aorta, not resulting in hemodynamically
significant stenosis. Focal noncalcified intraluminal plaque is seen
within the infrarenal abdominal aorta (image 22, series 4), not
resulting in a hemodynamically significant stenosis. No periaortic
stranding.

Celiac: There is a minimal amount of calcified atherosclerotic
plaque involving the origin the celiac artery, not resulting in a
hemodynamically significant stenosis. Conventional branching
pattern.

SMA: There is a minimal amount mixed calcified and noncalcified
atherosclerotic plaque involving the origin of the SMA. Eccentric
crescentic shaped noncalcified plaque is seen within mid/distal
aspect of the main trunk of the SMA (axial image 62, series 4;
sagittal image 88, series 9), approaching 50% luminal narrowing. The
distal tributaries of the SMA appear widely patent without discrete
intraluminal filling defect to suggest distal embolism.

Renals: Duplicated bilaterally; there are accessory renal arteries
supplying the inferior poles of the bilateral kidneys. Both dominant
renal arteries are widely patent without hemodynamically significant
narrowing. No vessel irregularity to suggest FMD.

IMA: Widely patent without hemodynamically significant narrowing.

Inflow: There is a moderate to large amount mixed calcified and
noncalcified atherosclerotic plaque involving the proximal and mid
aspects of the right external iliac artery (representative images
108 and 113, series 4), approaching tandem areas of 50% luminal
narrowing. There is a moderate amount of eccentric mixed calcified
and noncalcified atherosclerotic plaque involving the distal aspect
of the left external iliac artery (image 127, series 4), not
definitely resulting in hemodynamically significant stenosis.

The left common iliac artery is disease though without
hemodynamically significant stenosis

The bilateral internal iliac arteries are disease though patent and
of normal caliber.

Proximal Outflow: There is a minimal amount of eccentric mixed
calcified and noncalcified atherosclerotic plaque involving the
bilateral common and imaged portions of the bilateral superficial
femoral arteries, not definitely resulting in hemodynamically
significant stenosis.

Veins: The IVC and pelvic venous systems appear widely patent on
this arterial phase examination.

Review of the MIP images confirms the above findings.

_________________________________________________________

NON-VASCULAR

Lower chest: Limited visualization of the lower thorax demonstrates
minimal dependent subpleural ground-glass atelectasis. No pleural
effusion.

Normal heart size.  No pericardial effusion.

Hepatobiliary: There is mild diffuse decreased attenuation hepatic
parenchyma on this postcontrast examination. Mild nodularity hepatic
contour. There is a punctate (approximately 0.7 cm) area of
hyperenhancement involving left lobe of the liver (image 25, series
4), which is too small to accurately characterize. Otherwise, no
discrete hyperenhancing hepatic lesions. Normal appearance of the
gallbladder given degree of distention. No radiopaque gallstones. No
intra or extrahepatic biliary ductal dilatation. No ascites.

Pancreas: Normal appearance of the pancreas.

Spleen: Normal appearance of the spleen.

Adrenals/Urinary Tract: There is symmetric enhancement of the
bilateral kidneys. No definite renal stones on this postcontrast
examination as punctate calcification about the right renal pelvis
is favored to be vascular in etiology.

No discrete renal lesions. There is a minimal to moderate amount of
grossly symmetric bilateral perinephric stranding, similar to remote
abdominal CT performed 05/29/2015. No evidence of urinary
obstruction.

Ill-defined area of subtle decreased perfusion involving the
posterior interpolar aspect the right kidney is slightly improved
compared to the 12/18/2019 examination (representative axial image
61, series 4; coronal image 77, series 6).

There are no wedge shaped areas of decreased perfusion or
wedge-shaped atrophy involving either kidney to suggest acute or
chronic infarction.

Normal appearance the bilateral adrenal glands.

Normal appearance of the urinary bladder given degree of distention.

Stomach/Bowel: Ingested enteric contrast extends to the level of the
sigmoid colon. Scattered colonic diverticulosis without evidence of
superimposed acute diverticulitis. Normal appearance of the terminal
ileum and the retrocecal appendix. No pneumoperitoneum, pneumatosis
or portal venous gas.

Lymphatic: Scattered porta hepatis and retroperitoneal lymph nodes
are numerous though individually not enlarged by size criteria. No
bulky retroperitoneal, mesenteric, pelvic or inguinal
lymphadenopathy.

Reproductive: Borderline enlarged prostate gland. Scattered
dystrophic calcifications within the prostate. Suspected bilateral
hydroceles. No free fluid the pelvic cul-de-sac.

Other: Tiny mesenteric fat containing bilateral inguinal hernias.
Regional soft tissues appear normal. No acute or aggressive osseous
abnormalities. Mild multilevel lumbar spine DDD, worse at L2-L3 with
disc space height loss, endplate irregularity and sclerosis.

Musculoskeletal: No acute or aggressive osseous abnormalities.
IMPRESSION: VASCULAR

1. No evidence of renal artery stenosis. Additionally, there is no
evidence acute or chronic renal infarction.
2. Large amount of atherosclerotic plaque within a normal caliber
abdominal aorta. Aortic Atherosclerosis ([id number]).
3. Suspected 50% luminal narrowing involving the mid/distal aspect
of the main trunk of the SMA, however the distal tributaries of the
SMA appear widely patent without discrete intraluminal filling
defect to suggest distal embolism. Additionally, the celiac and IMA
are both patent without hemodynamically significant stenosis.
4. Suspected tandem areas of 50% luminal narrowing involving the
proximal aspect of the right external iliac artery. Correlation for
right lower extremity PAD symptoms is advised.

NON-VASCULAR

1. No evidence of acute or chronic renal infarction.
2. Subtle ill-defined hypoenhancement involving the posterior
interpolar aspect of the right kidney is improved compared to the
12/18/2019 examination and again may represent a subtle area of
pyelonephritis.
3. Symmetric likely age and body habitus related bilateral
perinephric stranding without evidence of urinary obstruction,
similar to the [DATE] examination.
4. Prostatomegaly. If not recently performed, further evaluation
with [facility] is advised.
5. Suspected hepatic steatosis with mild nodularity hepatic contour
as could be seen in the setting early cirrhotic change. Correlation
with LFTs is advised.
6. Punctate (approximately 0.7 cm) hyperenhancing apparent lesion
within the left lobe of the liver is too small to accurately
characterize though given concern for underlying cirrhosis, a
follow-up MRI in 6 months could be performed as clinically
indicated.
7. Scattered colonic diverticulosis without evidence superimposed
acute diverticulitis.

## 2021-07-13 NOTE — Progress Notes (Deleted)
Cardiology Office Note:   Date:  07/13/2021  NAME:  Jesse Mckee    MRN: [id number] DOB:  01-16-1956   PCP:  Marda Stalker, PA-C  Cardiologist:  Evalina Field, MD  Electrophysiologist:  None   Referring MD: Marda Stalker, PA-C   No chief complaint on file. ***  History of Present Illness:   Jesse Mckee is a 65 y.o. male with a hx of DM, HLD, PAD, coronary calcification, ascending aortic dilation (43 mm) who presents for follow-up. Values matched Echo and planned to repeat echo.    Problem List 1. Diabetes  -A1c 6.4 2. CAD -calcium seen on chest CT 3. Hyperlipidemia -T chol 97, HDL 32, LDL 42, TG 129 4. Ascending aortic dilation  -4.3 cm CTA 10/2019 -4.2 cm Echo 10/2020 5. Cirrhosis (Seen on chest CT) 6. PAD -50% SMA -50% narrowing R external iliac 7. Tobacco Abuse   Past Medical History: Past Medical History:  Diagnosis Date   Basal cell carcinoma (BCC) 10/25/2018   Diabetes mellitus without complication [facility])     Past Surgical History: No past surgical history on file.  Current Medications: No outpatient medications have been marked as taking for the 07/14/21 encounter (Appointment) with O'Neal, Cassie Freer, MD.     Allergies:    Patient has no known allergies.   Social History: Social History   Socioeconomic History   Marital status: Married    Spouse name: Not on file   Number of children: Not on file   Years of education: Not on file   Highest education level: Not on file  Occupational History   Not on file  Tobacco Use   Smoking status: Every Day    Packs/day: 1.00    Years: 49.00    Pack years: 49.00    Types: Cigarettes   Smokeless tobacco: Never  Vaping Use   Vaping Use: Never used  Substance and Sexual Activity   Alcohol use: No    Alcohol/week: 0.0 standard drinks   Drug use: No   Sexual activity: Not on file  Other Topics Concern   Not on file  Social History Narrative   Not on file   Social Determinants of Health    Financial Resource Strain: Not on file  Food Insecurity: Not on file  Transportation Needs: Not on file  Physical Activity: Not on file  Stress: Not on file  Social Connections: Not on file     Family History: The patient's ***family history includes Cancer in his brother and mother.  ROS:   All other ROS reviewed and negative. Pertinent positives noted in the HPI.     EKGs/Labs/Other Studies Reviewed:   The following studies were personally reviewed by me today:  EKG:  EKG is *** ordered today.  The ekg ordered today demonstrates ***, and was personally reviewed by me.   Recent Labs: 01/05/2021: ALT 14; BUN 16; Creatinine 0.95; Hemoglobin 15.8; Platelet Count 209; Potassium 4.2; Sodium 141   Recent Lipid Panel    Component Value Date/Time   CHOL 97 (L) 01/09/2020 0830   TRIG 129 01/09/2020 0830   HDL 32 (L) 01/09/2020 0830   CHOLHDL 3.0 01/09/2020 0830   LDLCALC 42 01/09/2020 0830    Physical Exam:   VS:  There were no vitals taken for this visit.   Wt Readings from Last 3 Encounters:  01/05/21 201 lb (91.2 kg)  07/09/20 (!) 210 lb (95.3 kg)  07/05/20 209 lb 6.4 oz (95 kg)    General:  Well nourished, well developed, in no acute distress Head: Atraumatic, normal size  Eyes: PEERLA, EOMI  Neck: Supple, no JVD Endocrine: No thryomegaly Cardiac: Normal S1, S2; RRR; no murmurs, rubs, or gallops Lungs: Clear to auscultation bilaterally, no wheezing, rhonchi or rales  Abd: Soft, nontender, no hepatomegaly  Ext: No edema, pulses 2+ Musculoskeletal: No deformities, BUE and BLE strength normal and equal Skin: Warm and dry, no rashes   Neuro: Alert and oriented to person, place, time, and situation, CNII-XII grossly intact, no focal deficits  Psych: Normal mood and affect   ASSESSMENT:   Jesse Mckee is a 65 y.o. male who presents for the following: No diagnosis found.  PLAN:   There are no diagnoses linked to this encounter.  {Are you ordering a CV Procedure (e.g.  stress test, cath, DCCV, TEE, etc)?   Press F2        :[id number]  Disposition: No follow-ups on file.  Medication Adjustments/Labs and Tests Ordered: Current medicines are reviewed at length with the patient today.  Concerns regarding medicines are outlined above.  No orders of the defined types were placed in this encounter.  No orders of the defined types were placed in this encounter.   There are no Patient Instructions on file for this visit.   Time Spent with Patient: I have spent a total of *** minutes with patient reviewing hospital notes, telemetry, EKGs, labs and examining the patient as well as establishing an assessment and plan that was discussed with the patient.  > 50% of time was spent in direct patient care.  Signed, Addison Naegeli. Audie Box, MD, [place]  [address] [place],  [address] [phone]  07/13/2021 8:20 PM

## 2021-07-14 ENCOUNTER — Ambulatory Visit: Payer: 59 | Admitting: Cardiovascular Disease

## 2021-07-14 DIAGNOSIS — I2510 Atherosclerotic heart disease of native coronary artery without angina pectoris: Secondary | ICD-10-CM

## 2021-07-14 DIAGNOSIS — I739 Peripheral vascular disease, unspecified: Secondary | ICD-10-CM

## 2021-07-14 DIAGNOSIS — I712 Thoracic aortic aneurysm, without rupture: Secondary | ICD-10-CM

## 2021-07-14 DIAGNOSIS — E782 Mixed hyperlipidemia: Secondary | ICD-10-CM

## 2021-07-14 DIAGNOSIS — Z720 Tobacco use: Secondary | ICD-10-CM

## 2021-09-06 ENCOUNTER — Encounter (HOSPITAL_COMMUNITY): Payer: Self-pay | Admitting: Cardiovascular Disease

## 2021-09-19 ENCOUNTER — Telehealth (HOSPITAL_COMMUNITY): Payer: Self-pay | Admitting: Cardiovascular Disease

## 2021-09-19 NOTE — Telephone Encounter (Signed)
Just an FYI. We have made several attempts to contact this patient including sending a letter to schedule or reschedule their echocardiogram. We will be removing the patient from the echo WQ.  MAILED LETTER [facility] 09/06/21  09/06/21 called and VM is full @ 3:26pm/[facility]  09/05/21 called to schedule and VM full @ 12:32/[facility] called HM# also and LVM @ 12:33pm/[facility]  08/30/21 [facility] to schedule for Nov and VM is full @ 9:32am/[facility]    Thank you

## 2022-02-24 ENCOUNTER — Other Ambulatory Visit: Payer: Self-pay | Admitting: Family Medicine

## 2022-02-24 DIAGNOSIS — K7460 Unspecified cirrhosis of liver: Secondary | ICD-10-CM

## 2022-06-08 ENCOUNTER — Encounter: Payer: Self-pay | Admitting: Cardiovascular Disease

## 2022-09-01 ENCOUNTER — Other Ambulatory Visit: Payer: Self-pay | Admitting: Family Medicine

## 2022-09-01 DIAGNOSIS — F17200 Nicotine dependence, unspecified, uncomplicated: Secondary | ICD-10-CM

## 2022-09-28 ENCOUNTER — Ambulatory Visit
Admission: RE | Admit: 2022-09-28 | Discharge: 2022-09-28 | Disposition: A | Discharge status: Home / Self Care | Payer: 59 | Source: Ambulatory Visit | Attending: Family Medicine | Admitting: Family Medicine

## 2022-09-28 DIAGNOSIS — F17200 Nicotine dependence, unspecified, uncomplicated: Secondary | ICD-10-CM

## 2023-10-03 ENCOUNTER — Encounter: Payer: Self-pay | Admitting: Nurse Practitioner

## 2023-10-30 ENCOUNTER — Other Ambulatory Visit: Payer: Self-pay | Admitting: Urology

## 2023-10-30 DIAGNOSIS — R972 Elevated prostate specific antigen [PSA]: Secondary | ICD-10-CM

## 2023-10-30 DIAGNOSIS — N402 Nodular prostate without lower urinary tract symptoms: Secondary | ICD-10-CM

## 2023-12-16 ENCOUNTER — Ambulatory Visit
Admission: RE | Admit: 2023-12-16 | Discharge: 2023-12-16 | Disposition: A | Discharge status: Home / Self Care | Payer: 59 | Source: Ambulatory Visit | Attending: Urology | Admitting: Urology

## 2023-12-16 DIAGNOSIS — R972 Elevated prostate specific antigen [PSA]: Secondary | ICD-10-CM

## 2023-12-16 DIAGNOSIS — N402 Nodular prostate without lower urinary tract symptoms: Secondary | ICD-10-CM

## 2023-12-16 MED ORDER — GADOPICLENOL 0.5 MMOL/ML IV SOLN
9.0000 mL | Freq: Once | INTRAVENOUS | Status: AC | PRN
  Administered 2023-12-16: 9 mL via INTRAVENOUS

## 2024-01-02 NOTE — Progress Notes (Signed)
01/02/2024 Spiros Rutigliano [id number] 1956-09-18   CHIEF COMPLAINT: Schedule a colonoscopy   HISTORY OF PRESENT ILLNESS: Jesse Mckee is a 68 year old male with a past medical history of hyperlipidemia, coronary calcification and atherosclerotic plaque, PAD, aortic dilation, emphysema, diabetes mellitus type II, chronic leukocytosis, alcohol use disorder (abstinent from alcohol since 2011), questionable cirrhosis, gallbladder polyps, GERD, Barrett's esophagus, atrophic gastritis, gastroparesis, diverticulosis and colon polyps. He presents to our office today as referred by Jarrett Soho PA-C for further evaluation regarding cirrhosis and to schedule a screening colonoscopy. He is passing a normal formed stool every other day. He takes a stool softener daily. No rectal bleeding or black stools. He has a history of GERD for which he takes Omeprazole 20mg  every day for at least the past 4 years. He develops active reflux symptoms if he skips one dose of Omeprazole. He takes Rolaids as needed. No dysphagia. He underwent an EGD by [facility] in 2021 which showed LA grade A esophagitis, gastritis and retained food in the stomach, Biopsies were positive for Barrett's esophagus, atrophic gastritis with gastric intestinal metaplasia and negative for H. Pylori and celiac disease. Gastric empty study 02/2020 showed evidence of gastroparesis. He reported undergoing 2 colonoscopies in his life time, both colonoscopies identified a few colon polyps, most recent colonoscopy was in 2020. Colonoscopy records not available in care every where. He denies any knowledge regarding history of liver disease. An abdominal sonogram 11/2019 showed small gallbladder polyps, a normal liver and mild splenomegaly. CTAP  11/2019 showed evidence of cirrhosis and portal hypertension. CTA 12/2019 showed hepatic steatosis with mild nodular hepatic contour suggestive of early cirrhosis and a 0.7cm lesion in the left liver lobe, too small to  further characterize. History of alcohol use disorder, previously drank 1/2 gallon of whiskey every other day for several years, abstinent since 2011. No known family history of liver disease.   History of coronary artery calcifications and PAD per CTA. On ASA 81mg  every day. Denies having any CP, palpitations or SOB.  He is undergoing going prostate evaluation at [facility] secondary to an elevated PSA and enlarged prostate. Prostate MRI 12/16/2023 showed BH without prostate lesion/mass. He is scheduled for a follow up appointment with his urologist 01/04/2024.   He fractured his left hip 10/21/2023. MRI 12/16/2023 showed left femoral neck lesion, probable stress fracture or early completed fracture of the left femoral neck. No surgery required for now, using a cane and walker, to follow up with ortho in 6 weeks. He can turn on his left hip without distress. He smokes cigarettes x 49 years.   Labs 01/05/2021: WBC 10.9. Hg 15.8. PLT 209. AST 14. ALT 14. Alk phos 76. T. Bili 0.5.   Labs 03/22/2023: WBC 10. Hg 16.2. MCV 91.1. PLT 177. T. Bili 0.9. Alk phos 109. AST 18. ALT 23.   Labs 09/13/2023: Alk phos 97. T. Bili 0.6. AST 18. ALT 17.  Abdominal sonogram 12/03/2019:  Gallbladder: No gallstones or wall thickening visualized. No sonographic Murphy sign noted by sonographer. Small polyps measure 4 mm or less in size.   Common bile duct: Diameter: 4 mm, within normal limits.   Liver: No focal lesion identified. Within normal limits in parenchymal echogenicity. Portal vein is patent on color Doppler imaging with normal direction of blood flow towards the liver.   IVC: No abnormality visualized.   Pancreas: Visualized portion unremarkable.   Spleen: 13.8 x 10.5 x 6.4 cm (volume 464 cc).   Right  Kidney: Length: 15.5 cm. Echogenicity within normal limits. No mass or hydronephrosis visualized.   Left Kidney: Length: 15.7 cm. Echogenicity within normal limits. No mass or hydronephrosis  visualized.   Abdominal aorta: No aneurysm visualized.   IMPRESSION: 1. No acute findings. 2. Mild splenomegaly.  CTAP with contrast 12/18/2019: Lower Chest: No acute findings.   Hepatobiliary: No hepatic masses identified. Mild caudate lobe hypertrophy and capsular nodularity are consistent with cirrhosis. Recanalization of paraumbilical veins is consistent with portal venous hypertension. Gallbladder is unremarkable. No evidence of biliary ductal dilatation.   Pancreas:  No mass or inflammatory changes.   Spleen: Mild splenomegaly with length of 13-14 cm. No splenic masses identified.   Adrenals/Urinary Tract: Normal adrenal glands and left kidney. Ill-defined areas of decreased parenchymal enhancement are seen in the upper and mid poles of the right kidney, likely due to pyelonephritis. No definite mass or abscess identified. No evidence of ureteral calculi or hydronephrosis.   Stomach/Bowel: No evidence of obstruction, inflammatory process or abnormal fluid collections. Normal appendix visualized.   Vascular/Lymphatic: No pathologically enlarged lymph nodes. No abdominal aortic aneurysm. Aortic atherosclerosis incidentally noted.   Reproductive:  Stable mildly enlarged prostate.   Other:  None.   Musculoskeletal:  No suspicious bone lesions identified.   IMPRESSION: 1. Ill-defined areas of decreased parenchymal enhancement in right kidney, consistent with pyelonephritis. No evidence of hydronephrosis. Recommend follow-up by CT in 2-3 months to confirm resolution. 2. Hepatic cirrhosis and findings of portal venous hypertension. No evidence of hepatic neoplasm. 3. Stable mildly enlarged prostate.  Aortic Atherosclerosis   Abd/pelvic CTA 12/23/2019: IMPRESSION: VASCULAR   1. No evidence of renal artery stenosis. Additionally, there is no evidence acute or chronic renal infarction. 2. Large amount of atherosclerotic plaque within a normal caliber abdominal  aorta. Aortic Atherosclerosis (ICD10-I70.0). 3. Suspected 50% luminal narrowing involving the mid/distal aspect of the main trunk of the SMA, however the distal tributaries of the SMA appear widely patent without discrete intraluminal filling defect to suggest distal embolism. Additionally, the celiac and IMA are both patent without hemodynamically significant stenosis. 4. Suspected tandem areas of 50% luminal narrowing involving the proximal aspect of the right external iliac artery. Correlation for right lower extremity PAD symptoms is advised.   NON-VASCULAR   1. No evidence of acute or chronic renal infarction. 2. Subtle ill-defined hypoenhancement involving the posterior interpolar aspect of the right kidney is improved compared to the 12/18/2019 examination and again may represent a subtle area of pyelonephritis. 3. Symmetric likely age and body habitus related bilateral perinephric stranding without evidence of urinary obstruction, similar to the 05/2015 examination. 4. Prostatomegaly. If not recently performed, further evaluation with DRE is advised. 5. Suspected hepatic steatosis with mild nodularity hepatic contour as could be seen in the setting early cirrhotic change. Correlation with LFTs is advised. 6. Punctate (approximately 0.7 cm) hyperenhancing apparent lesion within the left lobe of the liver is too small to accurately characterize though given concern for underlying cirrhosis, a follow-up MRI in 6 months could be performed as clinically indicated. 7. Scattered colonic diverticulosis without evidence superimposed acute diverticulitis.  ECHO 10/22/2020: 1. Left ventricular ejection fraction, by estimation, is 60 to 65%. The left ventricle has normal function. The left ventricle has no regional wall motion abnormalities. There is moderate basal-septal hypertrophy measuring 1.5cm; the rest of the LV segments have mild concentric hypertrophy . Left ventricular  diastolic parameters are consistent with Grade I diastolic dysfunction (impaired relaxation). 2. Right ventricular systolic function is normal.  The right ventricular size is normal. 3. The mitral valve is normal in structure. Trivial mitral valve regurgitation. 4. The aortic valve is tricuspid. There is moderate calcification of the aortic valve. There is mild thickening of the aortic valve. Aortic valve regurgitation is not visualized. Mild to moderate aortic valve sclerosis/calcification is present, without any evidence of aortic stenosis. 5. There is mild dilatation of the ascending aorta, measuring 42 mm. 6. The inferior vena cava is normal in size with greater than 50% respiratory variability, suggesting right atrial pressure of 3 mmHg.   PAST GI PROCEDURES:  EGD 2021 by [facility]:  Showed LA grade A esophagitis, gastritis and retained food in the stomach, Biopsies were positive for Barrett's esophagus, atrophic gastritis with gastric intestinal metaplasia and negative for H. Pylori and celiac disease.   Social History: He is married. He is a Lobbyist. He smokes cigarettes 1 to 1/2 ppd x 49 year. Abstinent from alcohol since 2011. No illegal drug use.   Family History: Mother with history of diabetes and breast cancer. Brother with lung cancer. Sister with diabetes and MS. Father with history of heart disease/MI.  No Known Allergies   Outpatient Encounter Medications as of 01/03/2024  Medication Sig   aspirin EC 81 MG tablet Take 1 tablet (81 mg total) by mouth daily.   metFORMIN (GLUCOPHAGE) 500 MG tablet Take 1 tablet (500 mg total) by mouth 2 (two) times daily with a meal.   OZEMPIC, 0.25 OR 0.5 MG/DOSE, 2 MG/1.5ML SOPN Inject into the skin once a week.   rosuvastatin (CRESTOR) 10 MG tablet Take 1 tablet (10 mg total) by mouth daily.   No facility-administered encounter medications on file as of 01/03/2024.   REVIEW OF SYSTEMS:  Gen: Denies fever, sweats or chills. No weight loss.   CV: Denies chest pain, palpitations or edema. Resp: Denies cough, shortness of breath of hemoptysis.  GI: Denies heartburn, dysphagia, stomach or lower abdominal pain. No diarrhea or constipation.  GU: Denies urinary burning, blood in urine, increased urinary frequency or incontinence. MS: Denies joint pain, muscles aches or weakness. Derm: Denies rash, itchiness, skin lesions or unhealing ulcers. Psych: Denies depression, anxiety, memory loss or confusion. Heme: Denies bruising, easy bleeding. Neuro:  Denies headaches, dizziness or paresthesias. Endo:  + DM type II.   PHYSICAL EXAM: BP 122/64   Ht 6' (1.829 m)   Wt 226 lb (102.5 kg)   BMI 30.65 kg/m   General: 68 year old male in no acute distress. Head: Normocephalic and atraumatic. Eyes:  Sclerae non-icteric, conjunctive pink. Ears: Normal auditory acuity. Mouth: Upper and lower dentures intact. No ulcers or lesions.  Neck: Supple, no lymphadenopathy or thyromegaly.  Lungs: Clear bilaterally to auscultation without wheezes, crackles or rhonchi. Heart: Regular rate and rhythm. No murmur, rub or gallop appreciated.  Abdomen: Soft, nontender, protuberant. No masses. No hepatosplenomegaly. Normoactive bowel sounds x 4 quadrants.  Rectal: Deferred.  Musculoskeletal: Symmetrical with no gross deformities. Skin: Warm and dry. No rash or lesions on visible extremities. Extremities: No edema. Stasis dermatitis.  Neurological: Alert oriented x 4, no focal deficits.  Psychological:  Alert and cooperative. Normal mood and affect.  ASSESSMENT AND PLAN:  68 year old male with a history of colon polyps who presents to schedule a colonoscopy. Patient reported last colonoscopy was done 5 years ago, a few polyps were removed. -Colonoscopy benefits and risks discussed including risk with sedation, risk of bleeding, perforation and infection  -Patient instructed to take Miralax  Q HS x 7 nights prior to colonoscopy prep date  -Request  colonoscopy procedure and biopsy results from [facility]  GERD, Barrett's esophagus and atrophic gastritis. GERD symptoms stable on Omeprazole 20mg  every day, however, he develops active reflux symptoms if he skips one dose. EGD 2021 by [facility] showed LA grade A esophagitis, gastritis and retained food in the stomach, biopsies were positive for Barrett's esophagus, atrophic gastritis with gastric intestinal metaplasia and negative for H. Pylori and celiac disease.  -EGD benefits and risks discussed including risk with sedation, risk of bleeding, perforation and infection  -GERD diet -Continue Omeprazole 20mg  QD  Questionable cirrhosis per CTAP 2020 and CTA 2021. Normal LFTs. Normal platelet count and normal albumin levels do not support the diagnosis of cirrhosis.  -Abdominal sonogram to evaluate the liver and spleen -CBC, CMP -If the above labs and abdominal sono show evidence of cirrhosis, patient will require further hepatology serologies to rule out autoimmune liver disease and hepatitis as well as PT/INR and AFP.  History of alcohol use disorder, abstinent since 2011 -Continue complete alcohol abstinence   Gallbladder polyps measuring 4mm or less per abdominal sono 11/2019 -Abdominal sonogram as ordered above   DM type II. On Ozempic.  -Patient instructed to hold Ozempic for one week prior to EGD/colonoscopy date   CAD, coronary calcifications per CT. No angina.   Ascending aortic dilatation   PAD  Chronic leukocytosis, likely reactive  -Continue follow up with Dr. Myna Hidalgo  Chronic tobacco [place] -Smoking cessation recommended     CC:  Jarrett Soho, PA-C

## 2024-01-03 ENCOUNTER — Encounter: Payer: Self-pay | Admitting: Nurse Practitioner

## 2024-01-03 ENCOUNTER — Ambulatory Visit: Payer: HMO | Admitting: Nurse Practitioner

## 2024-01-03 ENCOUNTER — Other Ambulatory Visit (INDEPENDENT_AMBULATORY_CARE_PROVIDER_SITE_OTHER): Payer: HMO

## 2024-01-03 VITALS — BP 122/64 | Ht 72.0 in | Wt 226.0 lb

## 2024-01-03 DIAGNOSIS — K219 Gastro-esophageal reflux disease without esophagitis: Secondary | ICD-10-CM

## 2024-01-03 DIAGNOSIS — R161 Splenomegaly, not elsewhere classified: Secondary | ICD-10-CM

## 2024-01-03 DIAGNOSIS — Z860100 Personal history of colon polyps, unspecified: Secondary | ICD-10-CM

## 2024-01-03 LAB — COMPREHENSIVE METABOLIC PANEL
ALT: 26 U/L (ref 0–53)
AST: 17 U/L (ref 0–37)
Albumin: 4.8 g/dL (ref 3.5–5.2)
Alkaline Phosphatase: 111 U/L (ref 39–117)
BUN: 16 mg/dL (ref 6–23)
CO2: 26 meq/L (ref 19–32)
Calcium: 9.6 mg/dL (ref 8.4–10.5)
Chloride: 101 meq/L (ref 96–112)
Creatinine, Ser: 0.77 mg/dL (ref 0.40–1.50)
GFR: 92.26 mL/min (ref >60.00)
Glucose, Bld: 225 mg/dL — ABNORMAL HIGH (ref 70–99)
Potassium: 4.1 meq/L (ref 3.5–5.1)
Sodium: 138 meq/L (ref 135–145)
Total Bilirubin: 0.5 mg/dL (ref 0.2–1.2)
Total Protein: 7.4 g/dL (ref 6.0–8.3)

## 2024-01-03 LAB — CBC
HCT: 49.2 % (ref 39.0–52.0)
Hemoglobin: 16.9 g/dL (ref 13.0–17.0)
MCHC: 34.4 g/dL (ref 30.0–36.0)
MCV: 91.8 fL (ref 78.0–100.0)
Platelets: 204 10*3/uL (ref 150.0–400.0)
RBC: 5.36 Mil/uL (ref 4.22–5.81)
RDW: 13.7 % (ref 11.5–15.5)
WBC: 12.4 10*3/uL — ABNORMAL HIGH (ref 4.0–10.5)

## 2024-01-03 NOTE — Patient Instructions (Addendum)
You have been scheduled for an endoscopy and colonoscopy. Please follow the written instructions given to you at your visit today.  If you use inhalers (even only as needed), please bring them with you on the day of your procedure.  DO NOT TAKE 7 DAYS PRIOR TO TEST- Ozempic, Wegovy (semaglutide) ____________________________________________ Eather Colas- take 1 capful mixed in 8 ounces of clear liquid of choice 7 days prior to your colonoscopy prep date (01/23/24)  Your provider has requested that you go to the basement level for lab work before leaving today. Press "B" on the elevator. The lab is located at the first door on the left as you exit the elevator.   You have been scheduled for an abdominal ultrasound at [facility] (1st floor of hospital) on _____________ at _______________. Please arrive 30 minutes prior to your appointment for registration. Make certain not to have anything to eat or drink 6 hours prior to your appointment. Should you need to reschedule your appointment, please contact radiology at [phone]. This test typically takes about 30 minutes to perform.   Due to recent changes in healthcare laws, you may see the results of your imaging and laboratory studies on MyChart before your provider has had a chance to review them.  We understand that in some cases there may be results that are confusing or concerning to you. Not all laboratory results come back in the same time frame and the provider may be waiting for multiple results in order to interpret others.  Please give [place] 48 hours in order for your provider to thoroughly review all the results before contacting the office for clarification of your results.   Thank you for trusting me with your gastrointestinal care!   Alcide Evener, CRNP

## 2024-01-04 ENCOUNTER — Encounter: Payer: Self-pay | Admitting: Nurse Practitioner

## 2024-01-04 DIAGNOSIS — Z860100 Personal history of colon polyps, unspecified: Secondary | ICD-10-CM | POA: Insufficient documentation

## 2024-01-11 ENCOUNTER — Other Ambulatory Visit: Payer: Self-pay

## 2024-01-11 ENCOUNTER — Telehealth: Payer: Self-pay | Admitting: Nurse Practitioner

## 2024-01-11 DIAGNOSIS — R161 Splenomegaly, not elsewhere classified: Secondary | ICD-10-CM

## 2024-01-11 DIAGNOSIS — R918 Other nonspecific abnormal finding of lung field: Secondary | ICD-10-CM

## 2024-01-11 NOTE — Progress Notes (Signed)
Agree with the assessment and plan as outlined by Alcide Evener, NP. Elastography would also be helpful to determine presence of cirrhosis.  Further recommendations on GIM/chronic gastritis/Barrett's surveillance will be based on EGD findings   Flora Parks E. Tomasa Rand, MD [facility]

## 2024-01-11 NOTE — Telephone Encounter (Signed)
Jesse Mckee, please contact radiology and change abdominal sonogram order to complete abdominal sonogram with elastography. Patient is currently scheduled for only an abdominal sonogram on 01/14/2024. THX.

## 2024-01-11 NOTE — Telephone Encounter (Signed)
Order for complete abdominal sonogram with elastography placed in Epic. Radiology contacted and order was changed  with the same time and date.

## 2024-01-14 ENCOUNTER — Ambulatory Visit (HOSPITAL_COMMUNITY)
Admission: RE | Admit: 2024-01-14 | Discharge: 2024-01-14 | Disposition: A | Discharge status: Home / Self Care | Payer: HMO | Source: Ambulatory Visit | Attending: Nurse Practitioner | Admitting: Nurse Practitioner

## 2024-01-14 ENCOUNTER — Ambulatory Visit (HOSPITAL_COMMUNITY): Payer: HMO

## 2024-01-14 ENCOUNTER — Encounter (HOSPITAL_COMMUNITY): Payer: Self-pay

## 2024-01-14 DIAGNOSIS — R161 Splenomegaly, not elsewhere classified: Secondary | ICD-10-CM | POA: Diagnosis present

## 2024-01-23 ENCOUNTER — Encounter: Payer: Self-pay | Admitting: Gastroenterology

## 2024-01-31 ENCOUNTER — Encounter: Payer: Self-pay | Admitting: Gastroenterology

## 2024-01-31 ENCOUNTER — Ambulatory Visit: Payer: PPO | Admitting: Gastroenterology

## 2024-01-31 VITALS — BP 153/74 | HR 77 | Temp 98.1°F | Resp 13 | Ht 72.0 in | Wt 226.0 lb

## 2024-01-31 DIAGNOSIS — K5730 Diverticulosis of large intestine without perforation or abscess without bleeding: Secondary | ICD-10-CM

## 2024-01-31 DIAGNOSIS — D123 Benign neoplasm of transverse colon: Secondary | ICD-10-CM | POA: Diagnosis not present

## 2024-01-31 DIAGNOSIS — K2950 Unspecified chronic gastritis without bleeding: Secondary | ICD-10-CM | POA: Diagnosis not present

## 2024-01-31 DIAGNOSIS — Z1211 Encounter for screening for malignant neoplasm of colon: Secondary | ICD-10-CM | POA: Diagnosis not present

## 2024-01-31 DIAGNOSIS — D125 Benign neoplasm of sigmoid colon: Secondary | ICD-10-CM

## 2024-01-31 DIAGNOSIS — K2270 Barrett's esophagus without dysplasia: Secondary | ICD-10-CM

## 2024-01-31 DIAGNOSIS — K635 Polyp of colon: Secondary | ICD-10-CM

## 2024-01-31 DIAGNOSIS — Z860100 Personal history of colon polyps, unspecified: Secondary | ICD-10-CM

## 2024-01-31 DIAGNOSIS — K219 Gastro-esophageal reflux disease without esophagitis: Secondary | ICD-10-CM

## 2024-01-31 MED ORDER — SODIUM CHLORIDE 0.9 % IV SOLN: 500.0000 mL | Freq: Once | INTRAVENOUS | Status: DC

## 2024-01-31 NOTE — Patient Instructions (Signed)
Please read handouts provided. Continue present medications. Await pathology results. Resume previous diet.   YOU HAD AN ENDOSCOPIC PROCEDURE TODAY AT THE [facility]:   Refer to the procedure report that was given to you for any specific questions about what was found during the examination.  If the procedure report does not answer your questions, please call your gastroenterologist to clarify.  If you requested that your care partner not be given the details of your procedure findings, then the procedure report has been included in a sealed envelope for you to review at your convenience later.  YOU SHOULD EXPECT: Some feelings of bloating in the abdomen. Passage of more gas than usual.  Walking can help get rid of the air that was put into your GI tract during the procedure and reduce the bloating. If you had a lower endoscopy (such as a colonoscopy or flexible sigmoidoscopy) you may notice spotting of blood in your stool or on the toilet paper. If you underwent a bowel prep for your procedure, you may not have a normal bowel movement for a few days.  Please Note:  You might notice some irritation and congestion in your nose or some drainage.  This is from the oxygen used during your procedure.  There is no need for concern and it should clear up in a day or so.  SYMPTOMS TO REPORT IMMEDIATELY:  Following lower endoscopy (colonoscopy or flexible sigmoidoscopy):  Excessive amounts of blood in the stool  Significant tenderness or worsening of abdominal pains  Swelling of the abdomen that is new, acute  Fever of 100F or higher  Following upper endoscopy (EGD)  Vomiting of blood or coffee ground material  New chest pain or pain under the shoulder blades  Painful or persistently difficult swallowing  New shortness of breath  Fever of 100F or higher  Black, tarry-looking stools  For urgent or emergent issues, a gastroenterologist can be reached at any hour by calling [phone]  [phone]. Do not use MyChart messaging for urgent concerns.    DIET:  We do recommend a small meal at first, but then you may proceed to your regular diet.  Drink plenty of fluids but you should avoid alcoholic beverages for 24 hours.  ACTIVITY:  You should plan to take it easy for the rest of today and you should NOT DRIVE or use heavy machinery until tomorrow (because of the sedation medicines used during the test).    FOLLOW UP: Our staff will call the number listed on your records the next business day following your procedure.  We will call around 7:15- 8:00 am to check on you and address any questions or concerns that you may have regarding the information given to you following your procedure. If we do not reach you, we will leave a message.     If any biopsies were taken you will be contacted by phone or by letter within the next 1-3 weeks.  Please call us at [phone] if you have not heard about the biopsies in 3 weeks.    SIGNATURES/CONFIDENTIALITY: You and/or your care partner have signed paperwork which will be entered into your electronic medical record.  These signatures attest to the fact that that the information above on your After Visit Summary has been reviewed and is understood.  Full responsibility of the confidentiality of this discharge information lies with you and/or your care-partner.

## 2024-01-31 NOTE — Progress Notes (Unsigned)
Sedate, gd SR, tolerated procedure well, VSS, report to RN

## 2024-01-31 NOTE — Progress Notes (Unsigned)
Called to room to assist during endoscopic procedure.  Patient ID and intended procedure confirmed with present staff. Received instructions for my participation in the procedure from the performing physician.

## 2024-01-31 NOTE — Op Note (Signed)
[facility] Patient Name: Jesse Mckee Procedure Date: 01/31/2024 3:23 PM MRN: [id number] Endoscopist: Lorin Picket E. Tomasa Rand , MD, [id number] Age: 68 Referring MD:  Date of Birth: 1956/04/11 Gender: Male Account #: [id number] Procedure:                Colonoscopy Indications:              Surveillance: Personal history of colonic polyps                            (unknown histology) on last colonoscopy 5 years ago Medicines:                Monitored Anesthesia Care Procedure:                Pre-Anesthesia Assessment:                           - Prior to the procedure, a History and Physical                            was performed, and patient medications and                            allergies were reviewed. The patient's tolerance of                            previous anesthesia was also reviewed. The risks                            and benefits of the procedure and the sedation                            options and risks were discussed with the patient.                            All questions were answered, and informed consent                            was obtained. Prior Anticoagulants: The patient has                            taken no anticoagulant or antiplatelet agents. ASA                            Grade Assessment: II - A patient with mild systemic                            disease. After reviewing the risks and benefits,                            the patient was deemed in satisfactory condition to                            undergo the procedure.  After obtaining informed consent, the colonoscope                            was passed under direct vision. Throughout the                            procedure, the patient's blood pressure, pulse, and                            oxygen saturations were monitored continuously. The                            CF HQ190L #[id number] was introduced through the anus                            and  advanced to the the cecum, identified by                            appendiceal orifice and ileocecal valve. The                            colonoscopy was performed without difficulty. The                            patient tolerated the procedure well. The quality                            of the bowel preparation was good. The ileocecal                            valve, appendiceal orifice, and rectum were                            photographed. The bowel preparation used was Plenvu                            via split dose instruction. Scope In: 3:55:39 PM Scope Out: 4:17:47 PM Scope Withdrawal Time: 0 hours 15 minutes 52 seconds  Total Procedure Duration: 0 hours 22 minutes 8 seconds  Findings:                 The perianal and digital rectal examinations were                            normal. Pertinent negatives include normal                            sphincter tone and no palpable rectal lesions.                           Two sessile polyps were found in the transverse                            colon. The polyps were 3 mm in size. These polyps  were removed with a cold snare. Resection and                            retrieval were complete. Estimated blood loss was                            minimal.                           A 3 mm polyp was found in the sigmoid colon. The                            polyp was sessile. The polyp was removed with a                            cold snare. Resection and retrieval were complete.                            Estimated blood loss was minimal.                           Many medium-mouthed and small-mouthed diverticula                            were found in the sigmoid colon, descending colon,                            transverse colon and ascending colon.                           The exam was otherwise normal throughout the                            examined colon.                           The  retroflexed view of the distal rectum and anal                            verge was normal and showed no anal or rectal                            abnormalities. Complications:            No immediate complications. Estimated Blood Loss:     Estimated blood loss was minimal. Impression:               - Two 3 mm polyps in the transverse colon, removed                            with a cold snare. Resected and retrieved.                           - One 3 mm polyp in the sigmoid colon, removed with  a cold snare. Resected and retrieved.                           - Moderate diverticulosis in the sigmoid colon, in                            the descending colon, in the transverse colon and                            in the ascending colon.                           - The distal rectum and anal verge are normal on                            retroflexion view. Recommendation:           - Patient has a contact number available for                            emergencies. The signs and symptoms of potential                            delayed complications were discussed with the                            patient. Return to normal activities tomorrow.                            Written discharge instructions were provided to the                            patient.                           - Resume previous diet.                           - Continue present medications.                           - Await pathology results.                           - Repeat colonoscopy (date not yet determined) for                            surveillance based on pathology results. Aireal Slater E. Tomasa Rand, MD 01/31/2024 4:33:01 PM This report has been signed electronically.

## 2024-01-31 NOTE — Progress Notes (Unsigned)
History and Physical Interval Note:  01/31/2024 3:35 PM  Jesse Mckee  has presented today for endoscopic procedure(s), with the diagnosis of  Encounter Diagnoses  Name Primary?   History of colonic polyps Yes   Gastroesophageal reflux disease without esophagitis   .  The various methods of evaluation and treatment have been discussed with the patient and/or family. After consideration of risks, benefits and other options for treatment, the patient has consented to  the endoscopic procedure(s).   The patient's history has been reviewed, patient examined, no change in status, stable for endoscopic procedure(s).  I have reviewed the patient's chart and labs.  Questions were answered to the patient's satisfaction.     Gaylen Pereira E. Tomasa Rand, MD [facility]

## 2024-01-31 NOTE — Op Note (Signed)
[facility] Patient Name: Ulmer Degen Procedure Date: 01/31/2024 3:33 PM MRN: [id number] Endoscopist: Lorin Picket E. Tomasa Rand , MD, [id number] Age: 68 Referring MD:  Date of Birth: 12/27/1955 Gender: Male Account #: [id number] Procedure:                Upper GI endoscopy Indications:              Surveillance for malignancy due to personal history                            of Barrett's esophagus, gastric intestinal                            metaplasia Medicines:                Monitored Anesthesia Care Procedure:                Pre-Anesthesia Assessment:                           - Prior to the procedure, a History and Physical                            was performed, and patient medications and                            allergies were reviewed. The patient's tolerance of                            previous anesthesia was also reviewed. The risks                            and benefits of the procedure and the sedation                            options and risks were discussed with the patient.                            All questions were answered, and informed consent                            was obtained. Prior Anticoagulants: The patient has                            taken no anticoagulant or antiplatelet agents. ASA                            Grade Assessment: II - A patient with mild systemic                            disease. After reviewing the risks and benefits,                            the patient was deemed in satisfactory condition to  undergo the procedure.                           After obtaining informed consent, the endoscope was                            passed under direct vision. Throughout the                            procedure, the patient's blood pressure, pulse, and                            oxygen saturations were monitored continuously. The                            Olympus scope [id number] was introduced through  the                            mouth, and advanced to the second part of duodenum.                            The upper GI endoscopy was accomplished without                            difficulty. The patient tolerated the procedure                            well. Scope In: Scope Out: Findings:                 The examined portions of the nasopharynx,                            oropharynx and larynx were normal.                           Two tongues of salmon-colored mucosa were present                            from 39 to 40 cm and scattered islands of                            salmon-colored mucosa were present at 38 cm. No                            other visible abnormalities were present. The                            maximum longitudinal extent of these esophageal                            mucosal changes was 1 cm in length. Biopsies were                            taken with a cold forceps for  histology. Estimated                            blood loss was minimal.                           The exam of the esophagus was otherwise normal.                           Diffuse atrophic mucosa was found in the gastric                            antrum. Six biopsies were taken with a cold forceps                            for Helicobacter pylori testing. Estimated blood                            loss was minimal.                           The exam of the stomach was otherwise normal. Six                            biopsies were taken with a cold forceps in the                            gastric body for histology.                           The examined duodenum was normal. Complications:            No immediate complications. Estimated Blood Loss:     Estimated blood loss was minimal. Impression:               - The examined portions of the nasopharynx,                            oropharynx and larynx were normal.                           - Salmon-colored mucosa suspicious for                             short-segment Barrett's esophagus. Biopsied.                           - Gastric mucosal atrophy. Biopsied.                           - Normal examined duodenum.                           - Biopsies were taken with a cold forceps for  histology in the gastric body to assess for GIM. Recommendation:           - Patient has a contact number available for                            emergencies. The signs and symptoms of potential                            delayed complications were discussed with the                            patient. Return to normal activities tomorrow.                            Written discharge instructions were provided to the                            patient.                           - Resume previous diet.                           - Continue present medications.                           - Await pathology results.                           - Repeat upper endoscopy (date not yet determined)                            for surveillance based on pathology results. Roselia Snipe E. Tomasa Rand, MD 01/31/2024 4:27:19 PM This report has been signed electronically.

## 2024-02-01 ENCOUNTER — Telehealth: Payer: Self-pay | Admitting: *Deleted

## 2024-02-01 NOTE — Telephone Encounter (Signed)
  Follow up Call-     01/31/2024    2:40 PM  Call back number  Post procedure Call Back phone  # [phone]  Permission to leave phone message Yes     Patient questions:  Do you have a fever, pain , or abdominal swelling? No. Pain Score  0 *  Have you tolerated food without any problems? Yes.    Have you been able to return to your normal activities? Yes.    Do you have any questions about your discharge instructions: Diet   No. Medications  No. Follow up visit  No.  Do you have questions or concerns about your Care? No.  Actions: * If pain score is 4 or above: No action needed, pain <4.

## 2024-02-05 LAB — SURGICAL PATHOLOGY

## 2024-02-07 ENCOUNTER — Encounter: Payer: Self-pay | Admitting: Gastroenterology

## 2024-02-07 NOTE — Progress Notes (Signed)
 Jesse Mckee,  The biopsies taken from your stomach were notable for mild chronic gastritis (inflammation) which is a common finding, but there was no evidence of Helicobacter pylori infection.  There is also no evidence of gastric intestinal metaplasia.  This is good news.  The biopsies of your distal esophagus did not show any evidence of Barrett's esophagus. Please continue taking your omeprazole daily.  I would recommend we repeat another upper endoscopy to recheck for Barrett's and gastric intestinal metaplasia in 3 years.  If no Barrett's or GIM or found at that time, then I would stop surveillance.  Two of the 3 polyps which I removed during your recent procedure were proven to be completely benign but are considered "pre-cancerous" polyps that MAY have grown into cancer if they had not been removed.  1 polyp was not precancerous.  Studies shows that at least 20% of women over age 34 and 30% of men over age 7 have pre-cancerous polyps.  Based on current nationally recognized surveillance guidelines, I recommend that you have a repeat colonoscopy in 7 years.   If you develop any new rectal bleeding, abdominal pain or significant bowel habit changes, please contact me before then.

## 2024-04-11 ENCOUNTER — Other Ambulatory Visit: Payer: Self-pay | Admitting: Family Medicine

## 2024-04-11 DIAGNOSIS — I7789 Other specified disorders of arteries and arterioles: Secondary | ICD-10-CM

## 2024-04-29 ENCOUNTER — Ambulatory Visit
Admission: RE | Admit: 2024-04-29 | Discharge: 2024-04-29 | Disposition: A | Discharge status: Home / Self Care | Source: Ambulatory Visit | Attending: Family Medicine | Admitting: Family Medicine

## 2024-04-29 DIAGNOSIS — I7789 Other specified disorders of arteries and arterioles: Secondary | ICD-10-CM

## 2024-04-29 MED ORDER — IOPAMIDOL (ISOVUE-370) INJECTION 76%
500.0000 mL | Freq: Once | INTRAVENOUS | Status: AC | PRN
  Administered 2024-04-29: 75 mL via INTRAVENOUS
